# Patient Record
Sex: Female | Born: 1950 | Race: Black or African American | Hispanic: No | Marital: Married | Smoking: Never smoker
Health system: Southern US, Community
[De-identification: ages and names within clinical notes are randomized; demographics above are authoritative.]

## PROBLEM LIST (undated history)

## (undated) DIAGNOSIS — I341 Nonrheumatic mitral (valve) prolapse: Secondary | ICD-10-CM

## (undated) DIAGNOSIS — I10 Essential (primary) hypertension: Secondary | ICD-10-CM

## (undated) DIAGNOSIS — M069 Rheumatoid arthritis, unspecified: Secondary | ICD-10-CM

## (undated) DIAGNOSIS — M1990 Unspecified osteoarthritis, unspecified site: Secondary | ICD-10-CM

## (undated) DIAGNOSIS — J849 Interstitial pulmonary disease, unspecified: Secondary | ICD-10-CM

## (undated) DIAGNOSIS — D509 Iron deficiency anemia, unspecified: Secondary | ICD-10-CM

## (undated) HISTORY — PX: SHOULDER SURGERY: SHX246

## (undated) HISTORY — PX: ELBOW SURGERY: SHX618

## (undated) HISTORY — PX: FOOT SURGERY: SHX648

## (undated) HISTORY — PX: CHOLECYSTECTOMY: SHX55

## (undated) HISTORY — PX: ABDOMINAL HYSTERECTOMY: SHX81

## (undated) HISTORY — PX: KNEE SURGERY: SHX244

## (undated) HISTORY — PX: TOTAL HIP ARTHROPLASTY: SHX124

---

## 2011-09-14 DIAGNOSIS — I10 Essential (primary) hypertension: Secondary | ICD-10-CM | POA: Diagnosis present

## 2011-09-14 DIAGNOSIS — Z8679 Personal history of other diseases of the circulatory system: Secondary | ICD-10-CM | POA: Insufficient documentation

## 2014-09-14 ENCOUNTER — Encounter (HOSPITAL_COMMUNITY): Payer: Self-pay | Admitting: Emergency Medicine

## 2014-09-14 ENCOUNTER — Emergency Department (HOSPITAL_COMMUNITY)
Admission: EM | Admit: 2014-09-14 | Discharge: 2014-09-14 | Disposition: A | Discharge status: Home / Self Care | Payer: Medicare Other | Attending: Emergency Medicine | Admitting: Emergency Medicine

## 2014-09-14 DIAGNOSIS — S90821A Blister (nonthermal), right foot, initial encounter: Secondary | ICD-10-CM

## 2014-09-14 DIAGNOSIS — Z9889 Other specified postprocedural states: Secondary | ICD-10-CM | POA: Insufficient documentation

## 2014-09-14 DIAGNOSIS — L988 Other specified disorders of the skin and subcutaneous tissue: Secondary | ICD-10-CM | POA: Insufficient documentation

## 2014-09-14 DIAGNOSIS — I10 Essential (primary) hypertension: Secondary | ICD-10-CM | POA: Diagnosis not present

## 2014-09-14 DIAGNOSIS — Z862 Personal history of diseases of the blood and blood-forming organs and certain disorders involving the immune mechanism: Secondary | ICD-10-CM | POA: Diagnosis not present

## 2014-09-14 DIAGNOSIS — Z8739 Personal history of other diseases of the musculoskeletal system and connective tissue: Secondary | ICD-10-CM | POA: Insufficient documentation

## 2014-09-14 HISTORY — DX: Nonrheumatic mitral (valve) prolapse: I34.1

## 2014-09-14 HISTORY — DX: Unspecified osteoarthritis, unspecified site: M19.90

## 2014-09-14 HISTORY — DX: Iron deficiency anemia, unspecified: D50.9

## 2014-09-14 HISTORY — DX: Rheumatoid arthritis, unspecified: M06.9

## 2014-09-14 HISTORY — DX: Essential (primary) hypertension: I10

## 2014-09-14 MED ORDER — CEPHALEXIN 500 MG PO CAPS
500.0000 mg | ORAL_CAPSULE | Freq: Once | ORAL | Status: AC
  Administered 2014-09-14: 500 mg via ORAL
  Filled 2014-09-14: qty 1

## 2014-09-14 MED ORDER — CEPHALEXIN 500 MG PO CAPS: 500.0000 mg | ORAL_CAPSULE | Freq: Four times a day (QID) | ORAL | Status: DC

## 2014-09-14 NOTE — ED Provider Notes (Signed)
CSN: [id number]     Arrival date & time 09/14/14  2139 History  This chart was scribed for non-physician practitioner, Emilia Beck, PA-C , working with Ward Givens, MD by Milly Jakob, ED Scribe. The patient was seen in room WTR7/WTR7. Patient's care was started at 10:46 PM.   Chief Complaint  Patient presents with  . Blister   The history is provided by the patient. No language interpreter was used.   HPI Comments: Bridget Woodard is a 63 y.o. female who presents to the Emergency Department complaining of a painful, itchy, blister on her right second toe. She reports that she noticed it when she removed her shoes to go to bed this evening. She reports that the pain is exacerbated by moving her toe. She denies any other acute problems. She denies having a PCP.  Past Medical History  Diagnosis Date  . RA (rheumatoid arthritis)   . Degenerative joint disease   . Microcytic anemia   . Hypertension   . Mitral valve prolapse    Past Surgical History  Procedure Laterality Date  . Shoulder surgery    . Total hip arthroplasty    . Elbow surgery    . Knee surgery    . Foot surgery    . Abdominal hysterectomy    . Cholecystectomy     No family history on file. History  Substance Use Topics  . Smoking status: Never Smoker   . Smokeless tobacco: Not on file  . Alcohol Use: No   OB History   Grav Para Term Preterm Abortions TAB SAB Ect Mult Living                 Review of Systems  Skin: Positive for color change (blister).  All other systems reviewed and are negative.   Allergies  Aspirin; Darvon; Iodine; Percocet; Shellfish allergy; and Sulfa antibiotics  Home Medications   Prior to Admission medications   Not on File   Triage Vitals: BP 158/76  Pulse 76  Temp(Src) 97.6 F (36.4 C) (Oral)  Resp 18  Ht 5\' 6"  (1.676 m)  Wt 165 lb (74.844 kg)  BMI 26.64 kg/m2  SpO2 99% Physical Exam  Nursing note and vitals reviewed. Constitutional: She is oriented to person,  place, and time. She appears well-developed and well-nourished. No distress.  HENT:  Head: Normocephalic and atraumatic.  Eyes: Conjunctivae and EOM are normal.  Neck: Neck supple. No tracheal deviation present.  Cardiovascular: Normal rate.   Pulmonary/Chest: Effort normal. No respiratory distress.  Musculoskeletal: Normal range of motion.  Neurological: She is alert and oriented to person, place, and time.  Skin: Skin is warm and dry.  Fluid filled blister to dorsal right second toe that is tender to palpation.  Psychiatric: She has a normal mood and affect. Her behavior is normal.    ED Course  Procedures (including critical care time) DIAGNOSTIC STUDIES: Oxygen Saturation is 99% on room air, normal by my interpretation.    COORDINATION OF CARE: 10:50 PM-Discussed treatment plan which includes ABX with pt at bedside and pt agreed to plan.   Labs Review Labs Reviewed - No data to display  Imaging Review No results found.   EKG Interpretation None      MDM   Final diagnoses:  Blister of foot, right, initial encounter    Patient will have Keflex for possible infected blister of right second toe. Vital stable and patient afebrile.   I personally performed the services described in  this documentation, which was scribed in my presence. The recorded information has been reviewed and is accurate.     Emilia Beck, PA-C 09/15/14 0150  Ward Givens, MD 09/27/14 1250

## 2014-09-14 NOTE — ED Notes (Signed)
Pt presents with blister to R second toe, unsure of etiology. Painful. Non draining

## 2014-09-14 NOTE — Discharge Instructions (Signed)
Take Keflex as directed until gone. Return to the ED with worsening or concerning symptoms.

## 2016-03-09 DIAGNOSIS — G894 Chronic pain syndrome: Secondary | ICD-10-CM | POA: Insufficient documentation

## 2019-01-09 DIAGNOSIS — M069 Rheumatoid arthritis, unspecified: Secondary | ICD-10-CM | POA: Diagnosis present

## 2020-06-04 DIAGNOSIS — Z7960 Long term (current) use of unspecified immunomodulators and immunosuppressants: Secondary | ICD-10-CM

## 2020-06-04 DIAGNOSIS — Z79899 Other long term (current) drug therapy: Secondary | ICD-10-CM

## 2020-09-02 DIAGNOSIS — F411 Generalized anxiety disorder: Secondary | ICD-10-CM | POA: Diagnosis present

## 2021-02-19 ENCOUNTER — Emergency Department: Payer: Medicare Other

## 2021-02-19 ENCOUNTER — Inpatient Hospital Stay
Admission: EM | Admit: 2021-02-19 | Discharge: 2021-02-26 | DRG: 394 | Disposition: A | Discharge status: Home / Self Care | Payer: Medicare Other | Attending: Internal Medicine | Admitting: Internal Medicine

## 2021-02-19 ENCOUNTER — Other Ambulatory Visit: Payer: Self-pay

## 2021-02-19 DIAGNOSIS — Z885 Allergy status to narcotic agent status: Secondary | ICD-10-CM

## 2021-02-19 DIAGNOSIS — Z886 Allergy status to analgesic agent status: Secondary | ICD-10-CM

## 2021-02-19 DIAGNOSIS — D84821 Immunodeficiency due to drugs: Secondary | ICD-10-CM | POA: Diagnosis present

## 2021-02-19 DIAGNOSIS — K559 Vascular disorder of intestine, unspecified: Principal | ICD-10-CM | POA: Diagnosis present

## 2021-02-19 DIAGNOSIS — Z96642 Presence of left artificial hip joint: Secondary | ICD-10-CM | POA: Diagnosis present

## 2021-02-19 DIAGNOSIS — I471 Supraventricular tachycardia: Secondary | ICD-10-CM | POA: Diagnosis present

## 2021-02-19 DIAGNOSIS — I10 Essential (primary) hypertension: Secondary | ICD-10-CM | POA: Diagnosis present

## 2021-02-19 DIAGNOSIS — Z9049 Acquired absence of other specified parts of digestive tract: Secondary | ICD-10-CM

## 2021-02-19 DIAGNOSIS — F1190 Opioid use, unspecified, uncomplicated: Secondary | ICD-10-CM

## 2021-02-19 DIAGNOSIS — Z91013 Allergy to seafood: Secondary | ICD-10-CM

## 2021-02-19 DIAGNOSIS — Z79899 Other long term (current) drug therapy: Secondary | ICD-10-CM

## 2021-02-19 DIAGNOSIS — R197 Diarrhea, unspecified: Secondary | ICD-10-CM | POA: Diagnosis not present

## 2021-02-19 DIAGNOSIS — K529 Noninfective gastroenteritis and colitis, unspecified: Secondary | ICD-10-CM

## 2021-02-19 DIAGNOSIS — Z20822 Contact with and (suspected) exposure to covid-19: Secondary | ICD-10-CM | POA: Diagnosis present

## 2021-02-19 DIAGNOSIS — I341 Nonrheumatic mitral (valve) prolapse: Secondary | ICD-10-CM | POA: Diagnosis present

## 2021-02-19 DIAGNOSIS — F419 Anxiety disorder, unspecified: Secondary | ICD-10-CM | POA: Diagnosis present

## 2021-02-19 DIAGNOSIS — R55 Syncope and collapse: Secondary | ICD-10-CM

## 2021-02-19 DIAGNOSIS — D509 Iron deficiency anemia, unspecified: Secondary | ICD-10-CM | POA: Diagnosis present

## 2021-02-19 DIAGNOSIS — J8410 Pulmonary fibrosis, unspecified: Secondary | ICD-10-CM | POA: Diagnosis present

## 2021-02-19 DIAGNOSIS — Z91041 Radiographic dye allergy status: Secondary | ICD-10-CM

## 2021-02-19 DIAGNOSIS — M069 Rheumatoid arthritis, unspecified: Secondary | ICD-10-CM | POA: Diagnosis present

## 2021-02-19 DIAGNOSIS — E876 Hypokalemia: Secondary | ICD-10-CM | POA: Diagnosis present

## 2021-02-19 DIAGNOSIS — F411 Generalized anxiety disorder: Secondary | ICD-10-CM | POA: Diagnosis present

## 2021-02-19 DIAGNOSIS — G8929 Other chronic pain: Secondary | ICD-10-CM | POA: Diagnosis present

## 2021-02-19 DIAGNOSIS — E860 Dehydration: Secondary | ICD-10-CM | POA: Diagnosis present

## 2021-02-19 DIAGNOSIS — D62 Acute posthemorrhagic anemia: Secondary | ICD-10-CM | POA: Diagnosis present

## 2021-02-19 DIAGNOSIS — Z79891 Long term (current) use of opiate analgesic: Secondary | ICD-10-CM

## 2021-02-19 DIAGNOSIS — N179 Acute kidney failure, unspecified: Secondary | ICD-10-CM | POA: Diagnosis present

## 2021-02-19 DIAGNOSIS — Z882 Allergy status to sulfonamides status: Secondary | ICD-10-CM

## 2021-02-19 HISTORY — DX: Interstitial pulmonary disease, unspecified: J84.9

## 2021-02-19 LAB — COMPREHENSIVE METABOLIC PANEL
ALT: 20 U/L (ref 0–44)
AST: 34 U/L (ref 15–41)
Albumin: 4.1 g/dL (ref 3.5–5.0)
Alkaline Phosphatase: 111 U/L (ref 38–126)
Anion gap: 12 (ref 5–15)
BUN: 25 mg/dL — ABNORMAL HIGH (ref 8–23)
CO2: 25 mmol/L (ref 22–32)
Calcium: 9.2 mg/dL (ref 8.9–10.3)
Chloride: 102 mmol/L (ref 98–111)
Creatinine, Ser: 1.69 mg/dL — ABNORMAL HIGH (ref 0.44–1.00)
GFR, Estimated: 33 mL/min — ABNORMAL LOW (ref >60)
Glucose, Bld: 174 mg/dL — ABNORMAL HIGH (ref 70–99)
Potassium: 2.9 mmol/L — ABNORMAL LOW (ref 3.5–5.1)
Sodium: 139 mmol/L (ref 135–145)
Total Bilirubin: 0.6 mg/dL (ref 0.3–1.2)
Total Protein: 8.4 g/dL — ABNORMAL HIGH (ref 6.5–8.1)

## 2021-02-19 LAB — ETHANOL: Alcohol, Ethyl (B): <10 mg/dL (ref <10)

## 2021-02-19 LAB — TROPONIN I (HIGH SENSITIVITY): Troponin I (High Sensitivity): 11 ng/L (ref <18)

## 2021-02-19 LAB — RESP PANEL BY RT-PCR (FLU A&B, COVID) ARPGX2
Influenza A by PCR: NEGATIVE
Influenza B by PCR: NEGATIVE
SARS Coronavirus 2 by RT PCR: NEGATIVE

## 2021-02-19 LAB — MAGNESIUM: Magnesium: 1.7 mg/dL (ref 1.7–2.4)

## 2021-02-19 LAB — LIPASE, BLOOD: Lipase: 33 U/L (ref 11–51)

## 2021-02-19 MED ORDER — POTASSIUM CHLORIDE 10 MEQ/100ML IV SOLN
10.0000 meq | INTRAVENOUS | Status: AC
Start: 2021-02-19 — End: 2021-02-20
  Administered 2021-02-20 (×2): 10 meq via INTRAVENOUS
  Filled 2021-02-19 (×2): qty 100

## 2021-02-19 MED ORDER — POTASSIUM CHLORIDE CRYS ER 20 MEQ PO TBCR
40.0000 meq | EXTENDED_RELEASE_TABLET | Freq: Once | ORAL | Status: DC
  Filled 2021-02-19: qty 2

## 2021-02-19 MED ORDER — SODIUM CHLORIDE 0.9 % IV BOLUS
1000.0000 mL | Freq: Once | INTRAVENOUS | Status: AC
  Administered 2021-02-19: 1000 mL via INTRAVENOUS

## 2021-02-19 MED ORDER — ONDANSETRON HCL 4 MG/2ML IJ SOLN
4.0000 mg | Freq: Once | INTRAMUSCULAR | Status: AC | PRN
  Administered 2021-02-20: 4 mg via INTRAVENOUS
  Filled 2021-02-19 (×2): qty 2

## 2021-02-19 NOTE — ED Provider Notes (Signed)
[facility] Emergency Department Provider Note  ____________________________________________   Event Date/Time   First MD Initiated Contact with Patient 02/19/21 2012     (approximate)  I have reviewed the triage vital signs and the nursing notes.   HISTORY  Chief Complaint Loss of Consciousness    HPI [name] is a 70 y.o. female with rheumatoid arthritis who comes in for syncopal episode.  Patient was with her husband and riding in the car when she started having multiple episodes of vomiting and diarrhea and then passed out.  She reports some abdominal pain and headache.  The abdominal pain is constant, nothing makes it better, nothing makes it worse.  Denies having this happen previously.  Was reportedly fine prior to the vomiting and diarrhea          Past Medical History:  Diagnosis Date  . Degenerative joint disease   . Hypertension   . Microcytic anemia   . Mitral valve prolapse   . RA (rheumatoid arthritis) (HCC)     There are no problems to display for this patient.   Past Surgical History:  Procedure Laterality Date  . ABDOMINAL HYSTERECTOMY    . CHOLECYSTECTOMY    . ELBOW SURGERY    . FOOT SURGERY    . KNEE SURGERY    . SHOULDER SURGERY    . TOTAL HIP ARTHROPLASTY      Prior to Admission medications   Medication Sig Start Date End Date Taking? Authorizing Provider  cephALEXin (KEFLEX) 500 MG capsule Take 1 capsule (500 mg total) by mouth 4 (four) times daily. 09/14/14   Emilia Beck, PA-C    Allergies Aspirin, Darvon [propoxyphene], Iodine, Percocet [oxycodone-acetaminophen], Shellfish allergy, and Sulfa antibiotics  No family history on file.  Social History Social History   Tobacco Use  . Smoking status: Never Smoker  Substance Use Topics  . Alcohol use: No  . Drug use: No      Review of Systems Constitutional: No fever/chills, LOC Eyes: No visual changes. ENT: No sore throat. Cardiovascular:  Denies chest pain. Respiratory: Denies shortness of breath. Gastrointestinal: Positive vomiting, diarrhea, abdominal pain Genitourinary: Negative for dysuria. Musculoskeletal: Negative for back pain. Skin: Negative for rash. Neurological: Negative for headaches, focal weakness or numbness. All other ROS negative ____________________________________________   PHYSICAL EXAM:  VITAL SIGNS: ED Triage Vitals  Enc Vitals Group     BP 02/19/21 2017 114/65     Pulse Rate 02/19/21 2017 (!) 115     Resp --      Temp 02/19/21 2017 97.6 F (36.4 C)     Temp Source 02/19/21 2017 Oral     SpO2 02/19/21 2017 99 %     Weight 02/19/21 2015 168 lb (76.2 kg)     Height 02/19/21 2015 5\' 6"  (1.676 m)     Head Circumference --      Peak Flow --      Pain Score --      Pain Loc --      Pain Edu? --      Excl. in GC? --     Constitutional: Alert and oriented.  But with sprain and very Eyes: Conjunctivae are normal. EOMI. Head: Atraumatic. Nose: No congestion/rhinnorhea. Mouth/Throat: Mucous membranes are moist.   Neck: No stridor. Trachea Midline. FROM Cardiovascular: Normal rate, regular rhythm. Grossly normal heart sounds.  Good peripheral circulation. Respiratory: Normal respiratory effort.  No retractions. Lungs CTAB. Gastrointestinal: Soft with some mild tenderness with old surgical  scar no distention. No abdominal bruits.  Musculoskeletal: No lower extremity tenderness nor edema.  No joint effusions. Neurologic:  Normal speech and language. No gross focal neurologic deficits are appreciated.  Wiggles toes bilaterally, squeezes hands weakly bilaterally. Skin:  Skin is warm, dry and intact. No rash noted. Psychiatric: Mood and affect are normal. Speech and behavior are normal. GU: Deferred   ____________________________________________   LABS (all labs ordered are listed, but only abnormal results are displayed)  Labs Reviewed  RESP PANEL BY RT-PCR (FLU A&B, COVID) ARPGX2  URINE  CULTURE  GASTROINTESTINAL PANEL BY PCR, STOOL (REPLACES STOOL CULTURE)  C DIFFICILE QUICK SCREEN W PCR REFLEX  CBC WITH DIFFERENTIAL/PLATELET  COMPREHENSIVE METABOLIC PANEL  LIPASE, BLOOD  URINALYSIS, COMPLETE (UACMP) WITH MICROSCOPIC  ETHANOL  MAGNESIUM  CBG MONITORING, ED  TROPONIN I (HIGH SENSITIVITY)  TROPONIN I (HIGH SENSITIVITY)   ____________________________________________   ED ECG REPORT I, Concha Se, the attending physician, personally viewed and interpreted this ECG.  Sinus tachycardia rate of 113, no ST elevation, no T wave inversions, normal intervals ____________________________________________  RADIOLOGY Vela Prose, personally viewed and evaluated these images (plain radiographs) as part of my medical decision making, as well as reviewing the written report by the radiologist.  ED MD interpretation: No pneumonia  Official radiology report(s): CT ABDOMEN PELVIS WO CONTRAST  Result Date: 02/19/2021 CLINICAL DATA:  Vomiting and diarrhea EXAM: CT ABDOMEN AND PELVIS WITHOUT CONTRAST TECHNIQUE: Multidetector CT imaging of the abdomen and pelvis was performed following the standard protocol without IV contrast. COMPARISON:  None. FINDINGS: Lower chest: Lung bases demonstrate lower lobe bronchiectasis and pulmonary fibrosis. Mild fibrosis in the right middle lobe and lingula. No acute airspace disease. Hepatobiliary: Status post cholecystectomy. Calcified granuloma in the liver. Intra and extrahepatic biliary ductal dilatation, common bile duct diameter up to 12 mm. Pancreas: Unremarkable. No pancreatic ductal dilatation or surrounding inflammatory changes. Spleen: Normal in size without focal abnormality. Adrenals/Urinary Tract: Adrenal glands are normal. Kidneys show no hydronephrosis. The urinary bladder is partially obscured by artifact Stomach/Bowel: Stomach nonenlarged. Fluid-filled nondistended pelvic small bowel loops. Diffuse fluid within the colon. No acute  bowel wall thickening. Negative appendix. Vascular/Lymphatic: Nonaneurysmal aorta.  No suspicious nodes Reproductive: Status post hysterectomy. No adnexal masses. Other: No free air or free fluid Musculoskeletal: Fatty atrophy of left iliopsoas muscle. Left hip replacement with artifact. No acute or suspicious osseous abnormality. IMPRESSION: 1. Fluid-filled small and large bowel, consistent with a diarrheal illness and probable enteritis. No acute bowel wall thickening. No obstruction. 2. Intra and extrahepatic biliary ductal dilatation post cholecystectomy. Recommend correlation with LFTs. 3. Pulmonary fibrosis at the lung bases. Electronically Signed   By: Jasmine Pang M.D.   On: 02/19/2021 21:39   CT Head Wo Contrast  Result Date: 02/19/2021 CLINICAL DATA:  Episode of emesis and diarrhea with syncope EXAM: CT HEAD WITHOUT CONTRAST CT CERVICAL SPINE WITHOUT CONTRAST TECHNIQUE: Multidetector CT imaging of the head and cervical spine was performed following the standard protocol without intravenous contrast. Multiplanar CT image reconstructions of the cervical spine were also generated. COMPARISON:  None. FINDINGS: CT HEAD FINDINGS Brain: No evidence of acute infarction, hemorrhage, hydrocephalus, extra-axial collection, visible mass lesion or mass effect. Symmetric prominence of the ventricles, cisterns and sulci compatible with parenchymal volume loss. Patchy areas of white matter hypoattenuation are most compatible with chronic microvascular angiopathy. Partially empty appearance of the sella. Remaining midline intracranial structures are unremarkable. Cerebellar tonsils are normally positioned. Scattered benign dural calcifications. Vascular:  Atherosclerotic calcification of the carotid siphons. No hyperdense vessel. Skull: No calvarial fracture or suspicious osseous lesion. No scalp swelling or hematoma. Hyperostosis frontalis interna, a typically benign incidental finding. Sinuses/Orbits: Paranasal  sinuses and mastoid air cells are predominantly clear. Included orbital structures are unremarkable. Other: Debris noted in the bilateral external auditory canals. Edentulous with dental prostheses. Mild bilateral TMJ arthrosis. CT CERVICAL SPINE FINDINGS Alignment: Stabilization collar is absent at the time of exam. There is notable cervical flexion. Likely contributing to the reversal the normal cervical lordosis. No evidence of traumatic listhesis. No abnormally widened, perched or jumped facets. Normal alignment of the craniocervical and atlantoaxial articulations. Skull base and vertebrae: No acute skull base fracture. No vertebral body fracture or height loss. Normal bone mineralization. No worrisome osseous lesions. Multilevel cervical spondylitic changes as below. Mild arthrosis at the atlantodental interval with some spurring about the anterior arch C1. Soft tissues and spinal canal: No pre or paravertebral fluid or swelling. No visible canal hematoma. Disc levels: Multilevel intervertebral disc height loss with spondylitic endplate changes. Discogenic spurring is most pronounced anteriorly albeit with some shallow posterior disc osteophyte complexes in addition to uncinate spurring and facet hypertrophic changes. Some partial effacement of the ventral thecal sac is noted C4-C7 resulting in mild canal stenosis. At least mild bilateral neural foraminal narrowing across these levels as well. Upper chest: No acute abnormality in the upper chest or imaged lung apices. Other: No concerning thyroid nodules or masses. IMPRESSION: 1. No acute intracranial abnormality. 2. Mild parenchymal volume loss and chronic microvascular ischemic white matter disease. 3. Partially empty appearance of the sella, a nonspecific finding. 4. Debris in the bilateral external auditory canals, correlate for cerumen impaction. 5. No acute fracture or traumatic listhesis of the cervical spine. 6. Multilevel cervical spondylitic and facet  degenerative changes, most pronounced C4-C7 where there is mild canal stenosis neural foraminal narrowing. Electronically Signed   By: Kreg Shropshire M.D.   On: 02/19/2021 21:40   CT Cervical Spine Wo Contrast  Result Date: 02/19/2021 CLINICAL DATA:  Episode of emesis and diarrhea with syncope EXAM: CT HEAD WITHOUT CONTRAST CT CERVICAL SPINE WITHOUT CONTRAST TECHNIQUE: Multidetector CT imaging of the head and cervical spine was performed following the standard protocol without intravenous contrast. Multiplanar CT image reconstructions of the cervical spine were also generated. COMPARISON:  None. FINDINGS: CT HEAD FINDINGS Brain: No evidence of acute infarction, hemorrhage, hydrocephalus, extra-axial collection, visible mass lesion or mass effect. Symmetric prominence of the ventricles, cisterns and sulci compatible with parenchymal volume loss. Patchy areas of white matter hypoattenuation are most compatible with chronic microvascular angiopathy. Partially empty appearance of the sella. Remaining midline intracranial structures are unremarkable. Cerebellar tonsils are normally positioned. Scattered benign dural calcifications. Vascular: Atherosclerotic calcification of the carotid siphons. No hyperdense vessel. Skull: No calvarial fracture or suspicious osseous lesion. No scalp swelling or hematoma. Hyperostosis frontalis interna, a typically benign incidental finding. Sinuses/Orbits: Paranasal sinuses and mastoid air cells are predominantly clear. Included orbital structures are unremarkable. Other: Debris noted in the bilateral external auditory canals. Edentulous with dental prostheses. Mild bilateral TMJ arthrosis. CT CERVICAL SPINE FINDINGS Alignment: Stabilization collar is absent at the time of exam. There is notable cervical flexion. Likely contributing to the reversal the normal cervical lordosis. No evidence of traumatic listhesis. No abnormally widened, perched or jumped facets. Normal alignment of the  craniocervical and atlantoaxial articulations. Skull base and vertebrae: No acute skull base fracture. No vertebral body fracture or height loss. Normal bone mineralization.  No worrisome osseous lesions. Multilevel cervical spondylitic changes as below. Mild arthrosis at the atlantodental interval with some spurring about the anterior arch C1. Soft tissues and spinal canal: No pre or paravertebral fluid or swelling. No visible canal hematoma. Disc levels: Multilevel intervertebral disc height loss with spondylitic endplate changes. Discogenic spurring is most pronounced anteriorly albeit with some shallow posterior disc osteophyte complexes in addition to uncinate spurring and facet hypertrophic changes. Some partial effacement of the ventral thecal sac is noted C4-C7 resulting in mild canal stenosis. At least mild bilateral neural foraminal narrowing across these levels as well. Upper chest: No acute abnormality in the upper chest or imaged lung apices. Other: No concerning thyroid nodules or masses. IMPRESSION: 1. No acute intracranial abnormality. 2. Mild parenchymal volume loss and chronic microvascular ischemic white matter disease. 3. Partially empty appearance of the sella, a nonspecific finding. 4. Debris in the bilateral external auditory canals, correlate for cerumen impaction. 5. No acute fracture or traumatic listhesis of the cervical spine. 6. Multilevel cervical spondylitic and facet degenerative changes, most pronounced C4-C7 where there is mild canal stenosis neural foraminal narrowing. Electronically Signed   By: Kreg Shropshire M.D.   On: 02/19/2021 21:40   DG Chest Portable 1 View  Result Date: 02/19/2021 CLINICAL DATA:  Shortness of breath, vomiting, and diarrhea. Then passed out. EXAM: PORTABLE CHEST 1 VIEW COMPARISON:  None. FINDINGS: Shallow inspiration. Coarse linear and alveolar infiltrates in both lung bases, greater on the right, with evidence of bronchiectasis and bronchial wall  thickening. Changes could represent chronic fibrosis, superimposed pneumonia/atelectasis, or aspiration. Heart size and pulmonary vascularity are normal. Mediastinal contours appear intact. Degenerative changes and postoperative change in the right shoulder. IMPRESSION: Coarse linear and alveolar infiltrates in both lung bases with evidence of bronchiectasis and bronchial wall thickening. Electronically Signed   By: Burman Nieves M.D.   On: 02/19/2021 21:41    ____________________________________________   PROCEDURES  Procedure(s) performed (including Critical Care):  .1-3 Lead EKG Interpretation Performed by: Concha Se, MD Authorized by: Concha Se, MD     Interpretation: abnormal     ECG rate:  90-110s   ECG rate assessment: normal     Rhythm: sinus rhythm     Ectopy: none     Conduction: normal   Comments:     Initially sinus tachycardia but normalized after fluids     ____________________________________________   INITIAL IMPRESSION / ASSESSMENT AND PLAN / ED COURSE  Bridget Woodard was evaluated in Emergency Department on 02/19/2021 for the symptoms described in the history of present illness. She was evaluated in the context of the global COVID-19 pandemic, which necessitated consideration that the patient might be at risk for infection with the SARS-CoV-2 virus that causes COVID-19. Institutional protocols and algorithms that pertain to the evaluation of patients at risk for COVID-19 are in a state of rapid change based on information released by regulatory bodies including the CDC and federal and state organizations. These policies and algorithms were followed during the patient's care in the ED.     Patient is a 70 year old who comes in with multiple episodes of vomiting diarrhea than syncopal episode.  Most likely secondary to dehydration versus vasovagal.  Will get labs evaluate for Electra abnormalities, AKI.  Will get CT head given patient is somewhat sleepy and  whispering to answer my questions to make sure no evidence of intercranial hemorrhage and CT abdomen to evaluate for obstruction.  Will give patient 2 L of fluid  and reassess afterwards.   CT head was negative, CT abdomen shows signs of enteritis.  There was also concern for some ductal dilation post cholecystectomy but her LFTs are normal.  Giving significant amount of diarrhea and ct concerning for enteritis will get stool studies and c diff.   Patient handed off to oncoming team pending labs and reevaluation after fluid        ____________________________________________   FINAL CLINICAL IMPRESSION(S) / ED DIAGNOSES   Final diagnoses:  Syncope, unspecified syncope type  AKI (acute kidney injury) (HCC)  Enteritis      MEDICATIONS GIVEN DURING THIS VISIT:  Medications  enoxaparin (LOVENOX) injection 40 mg (has no administration in time range)  lactated ringers infusion ( Intravenous New Bag/Given 02/20/21 0421)  acetaminophen (TYLENOL) tablet 650 mg (has no administration in time range)    Or  acetaminophen (TYLENOL) suppository 650 mg (has no administration in time range)  morphine 2 MG/ML injection 2 mg (2 mg Intravenous Given 02/20/21 0417)  ondansetron (ZOFRAN) tablet 4 mg (has no administration in time range)    Or  ondansetron (ZOFRAN) injection 4 mg (has no administration in time range)  promethazine (PHENERGAN) 12.5 mg in sodium chloride 0.9 % 50 mL IVPB (has no administration in time range)  pantoprazole (PROTONIX) injection 40 mg (40 mg Intravenous Given 02/20/21 0416)  sertraline (ZOLOFT) tablet 150 mg (150 mg Oral Given 02/20/21 0601)  amitriptyline (ELAVIL) tablet 50 mg (50 mg Oral Given 02/20/21 0601)  sodium chloride 0.9 % bolus 1,000 mL (0 mLs Intravenous Stopped 02/20/21 0206)  ondansetron (ZOFRAN) injection 4 mg (4 mg Intravenous Given 02/20/21 0201)  potassium chloride 10 mEq in 100 mL IVPB (0 mEq Intravenous Stopped 02/20/21 0256)  potassium chloride (KLOR-CON)  packet 40 mEq (40 mEq Oral Given 02/20/21 0028)  magnesium sulfate IVPB 2 g 50 mL (0 g Intravenous Stopped 02/20/21 0126)  fentaNYL (SUBLIMAZE) injection 50 mcg (50 mcg Intravenous Given 02/20/21 0153)  lactated ringers bolus 1,000 mL (0 mLs Intravenous Stopped 02/20/21 0350)     ED Discharge Orders    None       Note:  This document was prepared using Dragon voice recognition software and may include unintentional dictation errors.   Concha Se, MD 02/20/21 [phone]

## 2021-02-19 NOTE — ED Notes (Signed)
Ladona Ridgel, primary RN at bedside. Report to RN.

## 2021-02-19 NOTE — ED Notes (Signed)
Patient noted to have vomit to hair, and clothing. All clothing, and wig removed and placed at bedside.

## 2021-02-19 NOTE — ED Triage Notes (Signed)
Patient arrives via EMS. EMS reports the patient was riding in the car with her husband, when she had an episode of vomiting, and diarrhea, and then "passed out." EMS reports the patient reported to them that the patient had diarrhea x several times today prior to their car ride. Patient is responsive to verbal and painful stimuli on arrival, but is not forthcoming with information, and is delayed in answering questions. Patient appears drowsy. Diaphoresis noted to the patient's forehead. Husband and patient deny drug or alcohol intake.

## 2021-02-20 ENCOUNTER — Encounter: Payer: Self-pay | Admitting: Internal Medicine

## 2021-02-20 DIAGNOSIS — F1190 Opioid use, unspecified, uncomplicated: Secondary | ICD-10-CM

## 2021-02-20 DIAGNOSIS — Z20822 Contact with and (suspected) exposure to covid-19: Secondary | ICD-10-CM | POA: Diagnosis present

## 2021-02-20 DIAGNOSIS — K529 Noninfective gastroenteritis and colitis, unspecified: Secondary | ICD-10-CM | POA: Diagnosis not present

## 2021-02-20 DIAGNOSIS — Z91041 Radiographic dye allergy status: Secondary | ICD-10-CM | POA: Diagnosis not present

## 2021-02-20 DIAGNOSIS — N179 Acute kidney failure, unspecified: Secondary | ICD-10-CM

## 2021-02-20 DIAGNOSIS — Z79899 Other long term (current) drug therapy: Secondary | ICD-10-CM | POA: Diagnosis not present

## 2021-02-20 DIAGNOSIS — Z882 Allergy status to sulfonamides status: Secondary | ICD-10-CM | POA: Diagnosis not present

## 2021-02-20 DIAGNOSIS — E876 Hypokalemia: Secondary | ICD-10-CM | POA: Diagnosis present

## 2021-02-20 DIAGNOSIS — I471 Supraventricular tachycardia: Secondary | ICD-10-CM | POA: Diagnosis present

## 2021-02-20 DIAGNOSIS — R112 Nausea with vomiting, unspecified: Secondary | ICD-10-CM | POA: Diagnosis not present

## 2021-02-20 DIAGNOSIS — F419 Anxiety disorder, unspecified: Secondary | ICD-10-CM | POA: Diagnosis present

## 2021-02-20 DIAGNOSIS — Z91013 Allergy to seafood: Secondary | ICD-10-CM | POA: Diagnosis not present

## 2021-02-20 DIAGNOSIS — D62 Acute posthemorrhagic anemia: Secondary | ICD-10-CM | POA: Diagnosis present

## 2021-02-20 DIAGNOSIS — R55 Syncope and collapse: Secondary | ICD-10-CM | POA: Diagnosis not present

## 2021-02-20 DIAGNOSIS — J8410 Pulmonary fibrosis, unspecified: Secondary | ICD-10-CM | POA: Diagnosis present

## 2021-02-20 DIAGNOSIS — F411 Generalized anxiety disorder: Secondary | ICD-10-CM | POA: Diagnosis present

## 2021-02-20 DIAGNOSIS — I10 Essential (primary) hypertension: Secondary | ICD-10-CM | POA: Diagnosis not present

## 2021-02-20 DIAGNOSIS — G8929 Other chronic pain: Secondary | ICD-10-CM | POA: Diagnosis present

## 2021-02-20 DIAGNOSIS — K559 Vascular disorder of intestine, unspecified: Secondary | ICD-10-CM | POA: Diagnosis present

## 2021-02-20 DIAGNOSIS — E860 Dehydration: Secondary | ICD-10-CM | POA: Diagnosis present

## 2021-02-20 DIAGNOSIS — Z886 Allergy status to analgesic agent status: Secondary | ICD-10-CM | POA: Diagnosis not present

## 2021-02-20 DIAGNOSIS — R197 Diarrhea, unspecified: Secondary | ICD-10-CM | POA: Diagnosis present

## 2021-02-20 DIAGNOSIS — Z885 Allergy status to narcotic agent status: Secondary | ICD-10-CM | POA: Diagnosis not present

## 2021-02-20 DIAGNOSIS — M069 Rheumatoid arthritis, unspecified: Secondary | ICD-10-CM | POA: Diagnosis present

## 2021-02-20 DIAGNOSIS — I341 Nonrheumatic mitral (valve) prolapse: Secondary | ICD-10-CM | POA: Diagnosis present

## 2021-02-20 DIAGNOSIS — D509 Iron deficiency anemia, unspecified: Secondary | ICD-10-CM | POA: Diagnosis not present

## 2021-02-20 DIAGNOSIS — Z79891 Long term (current) use of opiate analgesic: Secondary | ICD-10-CM | POA: Diagnosis not present

## 2021-02-20 DIAGNOSIS — D84821 Immunodeficiency due to drugs: Secondary | ICD-10-CM | POA: Diagnosis present

## 2021-02-20 LAB — CBC WITH DIFFERENTIAL/PLATELET
Abs Immature Granulocytes: 0.04 10*3/uL (ref 0.00–0.07)
Basophils Absolute: 0.1 10*3/uL (ref 0.0–0.1)
Basophils Relative: 0 %
Eosinophils Absolute: 0.4 10*3/uL (ref 0.0–0.5)
Eosinophils Relative: 3 %
HCT: 39.4 % (ref 36.0–46.0)
Hemoglobin: 11.9 g/dL — ABNORMAL LOW (ref 12.0–15.0)
Immature Granulocytes: 0 %
Lymphocytes Relative: 59 %
Lymphs Abs: 7.1 10*3/uL — ABNORMAL HIGH (ref 0.7–4.0)
MCH: 24.7 pg — ABNORMAL LOW (ref 26.0–34.0)
MCHC: 30.2 g/dL (ref 30.0–36.0)
MCV: 81.7 fL (ref 80.0–100.0)
Monocytes Absolute: 0.4 10*3/uL (ref 0.1–1.0)
Monocytes Relative: 3 %
Neutro Abs: 4.3 10*3/uL (ref 1.7–7.7)
Neutrophils Relative %: 35 %
Platelets: 506 10*3/uL — ABNORMAL HIGH (ref 150–400)
RBC: 4.82 MIL/uL (ref 3.87–5.11)
RDW: 15.4 % (ref 11.5–15.5)
Smear Review: NORMAL
WBC: 12.3 10*3/uL — ABNORMAL HIGH (ref 4.0–10.5)
nRBC: 0 % (ref 0.0–0.2)

## 2021-02-20 LAB — GASTROINTESTINAL PANEL BY PCR, STOOL (REPLACES STOOL CULTURE)

## 2021-02-20 LAB — URINALYSIS, COMPLETE (UACMP) WITH MICROSCOPIC
Bacteria, UA: NONE SEEN
Bilirubin Urine: NEGATIVE
Glucose, UA: NEGATIVE mg/dL
Hgb urine dipstick: NEGATIVE
Ketones, ur: NEGATIVE mg/dL
Nitrite: NEGATIVE
Protein, ur: NEGATIVE mg/dL
Specific Gravity, Urine: 1.018 (ref 1.005–1.030)
pH: 5 (ref 5.0–8.0)

## 2021-02-20 LAB — C DIFFICILE QUICK SCREEN W PCR REFLEX
C Diff antigen: NEGATIVE
C Diff interpretation: NOT DETECTED
C Diff toxin: NEGATIVE

## 2021-02-20 LAB — BASIC METABOLIC PANEL
Anion gap: 11 (ref 5–15)
BUN: 23 mg/dL (ref 8–23)
CO2: 22 mmol/L (ref 22–32)
Calcium: 8.4 mg/dL — ABNORMAL LOW (ref 8.9–10.3)
Chloride: 106 mmol/L (ref 98–111)
Creatinine, Ser: 1.33 mg/dL — ABNORMAL HIGH (ref 0.44–1.00)
GFR, Estimated: 44 mL/min — ABNORMAL LOW (ref >60)
Glucose, Bld: 156 mg/dL — ABNORMAL HIGH (ref 70–99)
Potassium: 4.1 mmol/L (ref 3.5–5.1)
Sodium: 139 mmol/L (ref 135–145)

## 2021-02-20 LAB — CBC
HCT: 37.4 % (ref 36.0–46.0)
Hemoglobin: 11.2 g/dL — ABNORMAL LOW (ref 12.0–15.0)
MCH: 25.1 pg — ABNORMAL LOW (ref 26.0–34.0)
MCHC: 29.9 g/dL — ABNORMAL LOW (ref 30.0–36.0)
MCV: 83.7 fL (ref 80.0–100.0)
Platelets: 408 10*3/uL — ABNORMAL HIGH (ref 150–400)
RBC: 4.47 MIL/uL (ref 3.87–5.11)
RDW: 15.5 % (ref 11.5–15.5)
WBC: 27.9 10*3/uL — ABNORMAL HIGH (ref 4.0–10.5)
nRBC: 0 % (ref 0.0–0.2)

## 2021-02-20 LAB — MAGNESIUM: Magnesium: 1.8 mg/dL (ref 1.7–2.4)

## 2021-02-20 LAB — TROPONIN I (HIGH SENSITIVITY): Troponin I (High Sensitivity): 14 ng/L (ref <18)

## 2021-02-20 LAB — CREATININE, SERUM
Creatinine, Ser: 1.34 mg/dL — ABNORMAL HIGH (ref 0.44–1.00)
GFR, Estimated: 43 mL/min — ABNORMAL LOW (ref >60)

## 2021-02-20 LAB — HIV ANTIBODY (ROUTINE TESTING W REFLEX): HIV Screen 4th Generation wRfx: NONREACTIVE

## 2021-02-20 MED ORDER — POTASSIUM CHLORIDE 20 MEQ PO PACK
40.0000 meq | PACK | Freq: Once | ORAL | Status: AC
  Administered 2021-02-20: 40 meq via ORAL
  Filled 2021-02-20: qty 2

## 2021-02-20 MED ORDER — SODIUM CHLORIDE 0.9 % IV SOLN
12.5000 mg | Freq: Four times a day (QID) | INTRAVENOUS | Status: DC | PRN
  Filled 2021-02-20: qty 0.5

## 2021-02-20 MED ORDER — ALUM & MAG HYDROXIDE-SIMETH 200-200-20 MG/5ML PO SUSP
15.0000 mL | ORAL | Status: DC | PRN
  Administered 2021-02-20 – 2021-02-23 (×5): 15 mL via ORAL
  Filled 2021-02-20 (×6): qty 30

## 2021-02-20 MED ORDER — MORPHINE SULFATE (PF) 2 MG/ML IV SOLN
2.0000 mg | INTRAVENOUS | Status: DC | PRN
Start: 2021-02-20 — End: 2021-02-26
  Administered 2021-02-20 – 2021-02-26 (×27): 2 mg via INTRAVENOUS
  Filled 2021-02-20 (×28): qty 1

## 2021-02-20 MED ORDER — FENTANYL CITRATE (PF) 100 MCG/2ML IJ SOLN
50.0000 ug | Freq: Once | INTRAMUSCULAR | Status: AC
Start: 2021-02-20 — End: 2021-02-20
  Administered 2021-02-20: 50 ug via INTRAVENOUS
  Filled 2021-02-20: qty 2

## 2021-02-20 MED ORDER — SERTRALINE HCL 50 MG PO TABS
150.0000 mg | ORAL_TABLET | Freq: Every day | ORAL | Status: DC
  Administered 2021-02-20 – 2021-02-26 (×7): 150 mg via ORAL
  Filled 2021-02-20 (×8): qty 3

## 2021-02-20 MED ORDER — CIPROFLOXACIN IN D5W 400 MG/200ML IV SOLN
400.0000 mg | Freq: Two times a day (BID) | INTRAVENOUS | Status: DC
  Filled 2021-02-20 (×2): qty 200

## 2021-02-20 MED ORDER — ONDANSETRON HCL 4 MG PO TABS: 4.0000 mg | ORAL_TABLET | Freq: Four times a day (QID) | ORAL | Status: DC | PRN

## 2021-02-20 MED ORDER — METRONIDAZOLE IN NACL 5-0.79 MG/ML-% IV SOLN
500.0000 mg | Freq: Three times a day (TID) | INTRAVENOUS | Status: DC
  Administered 2021-02-20: 500 mg via INTRAVENOUS
  Filled 2021-02-20 (×3): qty 100

## 2021-02-20 MED ORDER — ACETAMINOPHEN 325 MG PO TABS: 650.0000 mg | ORAL_TABLET | Freq: Four times a day (QID) | ORAL | Status: DC | PRN

## 2021-02-20 MED ORDER — AMITRIPTYLINE HCL 25 MG PO TABS
50.0000 mg | ORAL_TABLET | Freq: Every day | ORAL | Status: DC | PRN
  Administered 2021-02-20 – 2021-02-25 (×6): 50 mg via ORAL
  Filled 2021-02-20 (×6): qty 2
  Filled 2021-02-20: qty 1

## 2021-02-20 MED ORDER — ACETAMINOPHEN 650 MG RE SUPP: 650.0000 mg | Freq: Four times a day (QID) | RECTAL | Status: DC | PRN

## 2021-02-20 MED ORDER — ENOXAPARIN SODIUM 40 MG/0.4ML ~~LOC~~ SOLN
40.0000 mg | SUBCUTANEOUS | Status: DC
  Administered 2021-02-20 – 2021-02-25 (×6): 40 mg via SUBCUTANEOUS
  Filled 2021-02-20 (×6): qty 0.4

## 2021-02-20 MED ORDER — LACTATED RINGERS IV SOLN: INTRAVENOUS | Status: DC

## 2021-02-20 MED ORDER — MAGNESIUM SULFATE 2 GM/50ML IV SOLN
2.0000 g | Freq: Once | INTRAVENOUS | Status: AC
  Administered 2021-02-20: 2 g via INTRAVENOUS
  Filled 2021-02-20: qty 50

## 2021-02-20 MED ORDER — ONDANSETRON HCL 4 MG/2ML IJ SOLN
4.0000 mg | Freq: Four times a day (QID) | INTRAMUSCULAR | Status: DC | PRN
  Administered 2021-02-20 – 2021-02-23 (×3): 4 mg via INTRAVENOUS
  Filled 2021-02-20 (×3): qty 2

## 2021-02-20 MED ORDER — PANTOPRAZOLE SODIUM 40 MG IV SOLR
40.0000 mg | INTRAVENOUS | Status: DC
  Administered 2021-02-20 – 2021-02-26 (×6): 40 mg via INTRAVENOUS
  Filled 2021-02-20 (×6): qty 40

## 2021-02-20 MED ORDER — LACTATED RINGERS IV BOLUS
1000.0000 mL | Freq: Once | INTRAVENOUS | Status: AC
  Administered 2021-02-20: 1000 mL via INTRAVENOUS

## 2021-02-20 NOTE — ED Notes (Signed)
Informed RN bed assigned [phone]

## 2021-02-20 NOTE — ED Notes (Signed)
Pt unhooked and able to walk to bedside commode. Informed to hit call bell when finished. Helped back to bed.

## 2021-02-20 NOTE — Progress Notes (Signed)
   02/20/21 2027  Assess: MEWS Score  Temp 99.7 F (37.6 C)  BP (!) 154/78  Pulse Rate (!) 121  Resp 16  SpO2 99 %  O2 Device Room Air  Assess: MEWS Score  MEWS Temp 0  MEWS Systolic 0  MEWS Pulse 2  MEWS RR 0  MEWS LOC 0  MEWS Score 2  MEWS Score Color Yellow  Assess: if the MEWS score is Yellow or Red  Were vital signs taken at a resting state? Yes  Focused Assessment No change from prior assessment  Early Detection of Sepsis Score *See Row Information* Low  MEWS guidelines implemented *See Row Information* No, previously yellow, continue vital signs every 4 hours  Treat  MEWS Interventions Administered scheduled meds/treatments  Pain Scale 0-10  Pain Score 7  Pain Type Acute pain  Pain Location Abdomen  Pain Orientation Lower  Take Vital Signs  Increase Vital Sign Frequency  Yellow: Q 2hr X 2 then Q 4hr X 2, if remains yellow, continue Q 4hrs  Escalate  MEWS: Escalate Yellow: discuss with charge nurse/RN and consider discussing with provider and RRT  Notify: Charge Nurse/RN  Name of Charge Nurse/RN Notified Forensic scientist  Date Charge Nurse/RN Notified 02/20/21  Time Charge Nurse/RN Notified 2045  Document  Progress note created (see row info) Yes  Patient not in acute distress, no change from admission symptoms.  Will continue to monitor.

## 2021-02-20 NOTE — Consult Note (Signed)
GI Inpatient Consult Note  Reason for Consult: Diarrhea, Rectal bleeding    Attending Requesting Consult: Dr. Mauro Kaufmann, MD  History of Present Illness: Bridget Woodard is a 70 y.o. female seen for evaluation of diarrhea, rectal bleeding at the request of Dr. Mauro Kaufmann, MD. Pt has a PMH of HTN, anxiety, Hx of SVT, rheumatoid arthritis, and DJD on chronic narcotics. Pt was admitted to [facility] last night via EMS after she had episode of vomiting, diarrhea, and then a reported syncopal episode while riding in the car with her husband. Upon presentation to the ED, she was tachycardic with HR 115 with otherwise normal vital signs. Blood work was significant for leukocytosis WBC 12K, creatinine 1.69, potassium 2.9, hemoglobin 11.9. EKG showed sinus tachycardia with nonspecific ST-T wave changes. Nonctrasted CT abd/pelvis showed fluid-filled small and large bowel consistent with diarrheal illness and probable enteritis. No bowel wall thickening or obstruction. GI panel and c diff PCR were negative. There were reports of two episodes of bright red blood per rectum. GI was consulted in context of hematochezia and diarrhea.   Patient seen and examined this afternoon resting in hospital bed. She reports her abdomen hurts all over, but points to her suprapubic and LLQ mostly. She rates pain 7/10 in severity. Pain described as crampy and aching. She reports having a BM doesn't seem to make the pain any better. She reports she has been passing bright red blood per rectum but is unable to quantify it for me. There has been one bowel movement today. She reports last week she was having constipation with straining on the toilet. She has seen [facility] previously which she attributed to hemorrhoidal disease. Hemoglobin this morning was 11.2. She denies any fevers, chills, sick contacts, or new medications. No sick contacts at home. She does take immunosuppressive medications at home for her hx of RA - leflunomide and methotrexate.  Last colonoscopy was reportedly 2 years ago but procedure report not available for review. She does report she had history of diverticulosis but doesn't recall if she ever had polyps.    Past Medical History:  Past Medical History:  Diagnosis Date  . Degenerative joint disease   . Hypertension   . Microcytic anemia   . Mitral valve prolapse   . RA (rheumatoid arthritis) (HCC)     Problem List: Patient Active Problem List   Diagnosis Date Noted  . Acute gastroenteritis 02/20/2021  . AKI (acute kidney injury) (HCC) 02/20/2021  . Hypokalemia 02/20/2021  . Hypomagnesemia 02/20/2021  . Syncope and collapse 02/20/2021  . Chronic narcotic use 02/20/2021  . Generalized anxiety disorder 09/02/2020  . 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 Rheumatoid arthritis (HCC) 01/09/2019  . Chronic pain syndrome 03/09/2016  . Essential hypertension 09/14/2011  . History of supraventricular tachycardia 09/14/2011    Past Surgical History: Past Surgical History:  Procedure Laterality Date  . ABDOMINAL HYSTERECTOMY    . CHOLECYSTECTOMY    . ELBOW SURGERY    . FOOT SURGERY    . KNEE SURGERY    . SHOULDER SURGERY    . TOTAL HIP ARTHROPLASTY      Allergies: Allergies  Allergen Reactions  . Aspirin   . Darvon [Propoxyphene]   . Iodine   . Percocet [Oxycodone-Acetaminophen]   . Shellfish Allergy   . Sulfa Antibiotics     Home Medications: Medications Prior to Admission  Medication Sig Dispense Refill Last Dose  . cephALEXin (KEFLEX) 500 MG capsule Take 1 capsule (  500 mg total) by mouth 4 (four) times daily. 40 capsule 0    Home medication reconciliation was completed with the patient.   Scheduled Inpatient Medications:   . enoxaparin (LOVENOX) injection  40 mg Subcutaneous Q24H  . pantoprazole (PROTONIX) IV  40 mg Intravenous Q24H  . sertraline  150 mg Oral Daily    Continuous Inpatient Infusions:   . lactated ringers 150 mL/hr at  02/20/21 0421  . promethazine (PHENERGAN) injection      PRN Inpatient Medications:  acetaminophen **OR** acetaminophen, amitriptyline, morphine injection, ondansetron **OR** ondansetron (ZOFRAN) IV, promethazine (PHENERGAN) injection  Family History: family history is not on file.  The patient's family history is negative for inflammatory bowel disorders, GI malignancy, or solid organ transplantation.  Social History:   reports that she has never smoked. She does not have any smokeless tobacco history on file. She reports that she does not drink alcohol and does not use drugs. The patient denies ETOH, tobacco, or drug use.   Review of Systems: Constitutional: Weight is stable.  Eyes: No changes in vision. ENT: No oral lesions, sore throat.  GI: see HPI.  Heme/Lymph: No easy bruising.  CV: No chest pain.  GU: No hematuria.  Integumentary: No rashes.  Neuro: No headaches.  Psych: No depression/anxiety.  Endocrine: No heat/cold intolerance.  Allergic/Immunologic: No urticaria.  Resp: No cough, SOB.  Musculoskeletal: No joint swelling.    Physical Examination: BP (!) 149/74 (BP Location: Left Arm)   Pulse (!) 121   Temp 98.6 F (37 C) (Oral)   Resp 18   Ht 5\' 6"  (1.676 m)   Wt 75.6 kg   SpO2 100%   BMI 26.91 kg/m   Non-toxic appearing female lying in hospital bed. Answers all questions.  Gen: NAD, alert and oriented x 4 HEENT: PEERLA, EOMI, Neck: supple, no JVD or thyromegaly Chest: CTA bilaterally, no wheezes, crackles, or other adventitious sounds CV: RRR, no m/g/c/r Abd: soft, ND, hypoactive BS in all four quadrants; tender to deep palpation in periumbilical and suprapubic regions, no HSM, guarding, ridigity, or rebound tenderness Ext: no edema, well perfused with 2+ pulses, Skin: no rash or lesions noted Lymph: no LAD  Data: Lab Results  Component Value Date   WBC 27.9 (H) 02/20/2021   HGB 11.2 (L) 02/20/2021   HCT 37.4 02/20/2021   MCV 83.7 02/20/2021   PLT  408 (H) 02/20/2021   Recent Labs  Lab 02/19/21 2028 02/20/21 0414  HGB 11.9* 11.2*   Lab Results  Component Value Date   NA 139 02/20/2021   K 4.1 02/20/2021   CL 106 02/20/2021   CO2 22 02/20/2021   BUN 23 02/20/2021   CREATININE 1.34 (H) 02/20/2021   CREATININE 1.33 (H) 02/20/2021   Lab Results  Component Value Date   ALT 20 02/19/2021   AST 34 02/19/2021   ALKPHOS 111 02/19/2021   BILITOT 0.6 02/19/2021   No results for input(s): APTT, INR, PTT in the last 168 hours.   CT abd/pelvis wo contrast 02/19/2021: IMPRESSION: 1. Fluid-filled small and large bowel, consistent with a diarrheal illness and probable enteritis. No acute bowel wall thickening. No obstruction. 2. Intra and extrahepatic biliary ductal dilatation post cholecystectomy. Recommend correlation with LFTs. 3. Pulmonary fibrosis at the lung bases.  Assessment/Plan:  70 y/o AA female with a PMH of HTN, anxiety, Hx of SVT, rheumatoid arthritis, and DJD on chronic narcotics admitted to [facility] last night for chief complaint of nausea, vomiting, and diarrhea and subsequent  syncopal episode. GI consulted due to concerns of bright red blood per rectum.  1. Acute gastroenteritis - report of nausea, vomiting, and diarrhea illness. GI PCR and c diff PCR negative. Pt is on supportive care with IV fluid hydration, IV antiemetics, and IV Protonix for gastric protection.  2. Hematochezia/Lower GI Bleed - hemodynamically insignificant bleeding. Hemoglobin 11.2 today. Possibly a diverticular bleed versus anal outlet etiology from internal hemorrhoids. Differential also includes stercoral changes, anal fissure, ischemic colitis, colon polyps, AVM, malignancy.  3. Syncopal episode - prior to hospitalization, likely 2/2 #1  4. AKI - likely prerenal in etiology, improving on IVF hydration  5. Hypokalemia - resolved s/p IV and oral repletion, 4.1 this am  COVID-19 Test:   NEGATIVE  Recommendations:  1. Maintain 2 large bore  IVs 2. Continue supportive care with IV fluid hydration, IV antiemetics, and IV Protonix for gastric protection 3. Continue to closely monitor H&H 4. Continue serial abdominal examinations 5. No plans for any urgent colonoscopy given lack of significant hemodynamic changes or overt bleeding 6. Agree with current management 7. GI will sign off for now and follow peripherally.   If there is evidence of overt bleeding or worsening diarrhea, please call Dr. Norma Fredrickson.   Thank you for the consult. Please call with questions or concerns.  Mickle Mallory [facility] [phone] (Cell)

## 2021-02-20 NOTE — Progress Notes (Addendum)
Subjective: Patient admitted this morning, see detailed H&P by Dr Para March 70 year old female with medical history of hypertension, anxiety, history of SVT, rheumatoid arthritis, DJD on chronic narcotics came to ED with 1 day history of several episodes of vomiting and diarrhea and passing out episode.  CT abdomen/pelvis showed diarrheal illness and probable enteritis.  GI stool pathogen panel was negative.  C. difficile PCR was negative.  Patient also had bright red blood per rectum.  Vitals:   02/20/21 0947 02/20/21 1119  BP: (!) 169/82 (!) 149/74  Pulse: (!) 113 (!) 121  Resp: 18 18  Temp: 98.5 F (36.9 C) 98.6 F (37 C)  SpO2: 100% 100%      A/P Acute gastroenteritis-CT abdomen/pelvis shows enteritis, patient started on supportive care with IV hydration, IV antiemetics, IV Protonix.  Syncope-likely from gastroenteritis.  Continue to monitor on telemetry.  Acute kidney injury-creatinine was 1.69 on presentation, baseline 1.0.  Likely prerenal azotemia.  Today creatinine is 1.33.  Follow BMP in am.  Hematochezia-patient states that she had colonoscopy 2 years ago at that time she was told that she has diverticulosis.  She had 2 episodes of bright red blood per rectum.  Will consult gastroenterology for further management.  Hemoglobin is stable at 11.2.  Hypokalemia-replete, potassium was 4.1 this morning.  Hypertension-lisinopril is on hold due to acute kidney injury.    Meredeth Ide [facility] [phone]

## 2021-02-20 NOTE — H&P (Signed)
History and Physical    Bridget Woodard PIR:[id number] DOB: 02-Jan-1951 DOA: 02/19/2021  PCP: Patient, No Pcp Per (Inactive)   Patient coming from: Home  I have personally briefly reviewed patient's old medical records in [facility]  Chief Complaint: Diarrhea, vomiting, passed out  HPI: Bridget Woodard is a 70 y.o. 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  Has associated mild colicky abdominal pain, with no aggravating or alleviating factors.,  Denies fever or chills.  No affected contacts and no offending foods.  History limited due to lethargy. ED course: On arrival BP 114/65, pulse 115, temp 97.6, O2 sat 99% on room air.  Blood work significant for leukocytosis of 12,000, creatinine of 1.69, potassium 2.9.  Troponin normal at 14.  Urinalysis with large leukocyte esterase.  GI panel and stool for C. difficile pending EKG, personally reviewed and interpreted: Sinus tachycardia at 113 with nonspecific ST-T wave changes Imaging: CT abdomen and pelvis consistent with diarrheal illness and probable enteritis CT head and C-spine no acute findings  Patient was hydrated in the emergency room, given potassium and magnesium repletion.  Hospitalist consulted for admission.    Review of Systems: As per HPI otherwise all other systems on review of systems negative.    Past Medical History:  Diagnosis Date  . Degenerative joint disease   . Hypertension   . Microcytic anemia   . Mitral valve prolapse   . RA (rheumatoid arthritis) (HCC)     Past Surgical History:  Procedure Laterality Date  . ABDOMINAL HYSTERECTOMY    . CHOLECYSTECTOMY    . ELBOW SURGERY    . FOOT SURGERY    . KNEE SURGERY    . SHOULDER SURGERY    . TOTAL HIP ARTHROPLASTY       reports that she has never smoked.  She does not have any smokeless tobacco history on file. She reports that she does not drink alcohol and does not use drugs.  Allergies  Allergen Reactions  . Aspirin   . Darvon [Propoxyphene]   . Iodine   . Percocet [Oxycodone-Acetaminophen]   . Shellfish Allergy   . Sulfa Antibiotics     History reviewed. No pertinent family history.    Prior to Admission medications   Medication Sig Start Date End Date Taking? Authorizing Provider  cephALEXin (KEFLEX) 500 MG capsule Take 1 capsule (500 mg total) by mouth 4 (four) times daily. 09/14/14   Emilia Beck, PA-C    Physical Exam: Vitals:   02/19/21 2017 02/19/21 2248 02/20/21 0145 02/20/21 0146  BP: 114/65 123/75 (!) 143/76 (!) 147/79  Pulse: (!) 115 97 (!) 106 (!) 105  Resp:  (!) 21 19 (!) 22  Temp: 97.6 F (36.4 C)     TempSrc: Oral     SpO2: 99% 100% 100% 100%  Weight:      Height:         Vitals:   02/19/21 2017 02/19/21 2248 02/20/21 0145 02/20/21 0146  BP: 114/65 123/75 (!) 143/76 (!) 147/79  Pulse: (!) 115 97 (!) 106 (!) 105  Resp:  (!) 21 19 (!) 22  Temp: 97.6 F (36.4 C)     TempSrc: Oral     SpO2: 99% 100% 100% 100%  Weight:      Height:  Constitutional:  Weak and ill-appearing, oriented x 3 . Not in any apparent distress HEENT:      Head: Normocephalic and atraumatic.         Eyes: PERLA, EOMI, Conjunctivae are normal. Sclera is non-icteric.       Mouth/Throat: Mucous membranes are moist.       Neck: Supple with no signs of meningismus. Cardiovascular:  Tachycardic. No murmurs, gallops, or rubs. 2+ symmetrical distal pulses are present . No JVD. No LE edema Respiratory: Respiratory effort normal .Lungs sounds clear bilaterally. No wheezes, crackles, or rhonchi.  Gastrointestinal: Soft, periumbilical tendernessr, and non distended with positive bowel sounds.  Genitourinary: No CVA tenderness. Musculoskeletal: Nontender with normal range of motion in all extremities. No cyanosis, or  erythema of extremities. Neurologic:  Face is symmetric. Moving all extremities. No gross focal neurologic deficits . Skin: Skin is warm, dry.  No rash or ulcers Psychiatric: Mood and affect are normal    Labs on Admission: I have personally reviewed following labs and imaging studies  CBC: Recent Labs  Lab 02/19/21 2028  WBC 12.3*  NEUTROABS 4.3  HGB 11.9*  HCT 39.4  MCV 81.7  PLT 506*   Basic Metabolic Panel: Recent Labs  Lab 02/19/21 2028  NA 139  K 2.9*  CL 102  CO2 25  GLUCOSE 174*  BUN 25*  CREATININE 1.69*  CALCIUM 9.2  MG 1.7   GFR: Estimated Creatinine Clearance: 33.2 mL/min (A) (by C-G formula based on SCr of 1.69 mg/dL (H)). Liver Function Tests: Recent Labs  Lab 02/19/21 2028  AST 34  ALT 20  ALKPHOS 111  BILITOT 0.6  PROT 8.4*  ALBUMIN 4.1   Recent Labs  Lab 02/19/21 2028  LIPASE 33   No results for input(s): AMMONIA in the last 168 hours. Coagulation Profile: No results for input(s): INR, PROTIME in the last 168 hours. Cardiac Enzymes: No results for input(s): CKTOTAL, CKMB, CKMBINDEX, TROPONINI in the last 168 hours. BNP (last 3 results) No results for input(s): PROBNP in the last 8760 hours. HbA1C: No results for input(s): HGBA1C in the last 72 hours. CBG: No results for input(s): GLUCAP in the last 168 hours. Lipid Profile: No results for input(s): CHOL, HDL, LDLCALC, TRIG, CHOLHDL, LDLDIRECT in the last 72 hours. Thyroid Function Tests: No results for input(s): TSH, T4TOTAL, FREET4, T3FREE, THYROIDAB in the last 72 hours. Anemia Panel: No results for input(s): VITAMINB12, FOLATE, FERRITIN, TIBC, IRON, RETICCTPCT in the last 72 hours. Urine analysis:    Component Value Date/Time   COLORURINE YELLOW (A) 02/19/2021 2028   APPEARANCEUR CLEAR (A) 02/19/2021 2028   LABSPEC 1.018 02/19/2021 2028   PHURINE 5.0 02/19/2021 2028   GLUCOSEU NEGATIVE 02/19/2021 2028   HGBUR NEGATIVE 02/19/2021 2028   BILIRUBINUR NEGATIVE 02/19/2021  2028   KETONESUR NEGATIVE 02/19/2021 2028   PROTEINUR NEGATIVE 02/19/2021 2028   NITRITE NEGATIVE 02/19/2021 2028   LEUKOCYTESUR LARGE (A) 02/19/2021 2028    Radiological Exams on Admission: CT ABDOMEN PELVIS WO CONTRAST  Result Date: 02/19/2021 CLINICAL DATA:  Vomiting and diarrhea EXAM: CT ABDOMEN AND PELVIS WITHOUT CONTRAST TECHNIQUE: Multidetector CT imaging of the abdomen and pelvis was performed following the standard protocol without IV contrast. COMPARISON:  None. FINDINGS: Lower chest: Lung bases demonstrate lower lobe bronchiectasis and pulmonary fibrosis. Mild fibrosis in the right middle lobe and lingula. No acute airspace disease. Hepatobiliary: Status post cholecystectomy. Calcified granuloma in the liver. Intra and extrahepatic biliary ductal dilatation, common bile duct diameter up to 12  mm. Pancreas: Unremarkable. No pancreatic ductal dilatation or surrounding inflammatory changes. Spleen: Normal in size without focal abnormality. Adrenals/Urinary Tract: Adrenal glands are normal. Kidneys show no hydronephrosis. The urinary bladder is partially obscured by artifact Stomach/Bowel: Stomach nonenlarged. Fluid-filled nondistended pelvic small bowel loops. Diffuse fluid within the colon. No acute bowel wall thickening. Negative appendix. Vascular/Lymphatic: Nonaneurysmal aorta.  No suspicious nodes Reproductive: Status post hysterectomy. No adnexal masses. Other: No free air or free fluid Musculoskeletal: Fatty atrophy of left iliopsoas muscle. Left hip replacement with artifact. No acute or suspicious osseous abnormality. IMPRESSION: 1. Fluid-filled small and large bowel, consistent with a diarrheal illness and probable enteritis. No acute bowel wall thickening. No obstruction. 2. Intra and extrahepatic biliary ductal dilatation post cholecystectomy. Recommend correlation with LFTs. 3. Pulmonary fibrosis at the lung bases. Electronically Signed   By: Jasmine Pang M.D.   On: 02/19/2021 21:39    CT Head Wo Contrast  Result Date: 02/19/2021 CLINICAL DATA:  Episode of emesis and diarrhea with syncope EXAM: CT HEAD WITHOUT CONTRAST CT CERVICAL SPINE WITHOUT CONTRAST TECHNIQUE: Multidetector CT imaging of the head and cervical spine was performed following the standard protocol without intravenous contrast. Multiplanar CT image reconstructions of the cervical spine were also generated. COMPARISON:  None. FINDINGS: CT HEAD FINDINGS Brain: No evidence of acute infarction, hemorrhage, hydrocephalus, extra-axial collection, visible mass lesion or mass effect. Symmetric prominence of the ventricles, cisterns and sulci compatible with parenchymal volume loss. Patchy areas of white matter hypoattenuation are most compatible with chronic microvascular angiopathy. Partially empty appearance of the sella. Remaining midline intracranial structures are unremarkable. Cerebellar tonsils are normally positioned. Scattered benign dural calcifications. Vascular: Atherosclerotic calcification of the carotid siphons. No hyperdense vessel. Skull: No calvarial fracture or suspicious osseous lesion. No scalp swelling or hematoma. Hyperostosis frontalis interna, a typically benign incidental finding. Sinuses/Orbits: Paranasal sinuses and mastoid air cells are predominantly clear. Included orbital structures are unremarkable. Other: Debris noted in the bilateral external auditory canals. Edentulous with dental prostheses. Mild bilateral TMJ arthrosis. CT CERVICAL SPINE FINDINGS Alignment: Stabilization collar is absent at the time of exam. There is notable cervical flexion. Likely contributing to the reversal the normal cervical lordosis. No evidence of traumatic listhesis. No abnormally widened, perched or jumped facets. Normal alignment of the craniocervical and atlantoaxial articulations. Skull base and vertebrae: No acute skull base fracture. No vertebral body fracture or height loss. Normal bone mineralization. No worrisome  osseous lesions. Multilevel cervical spondylitic changes as below. Mild arthrosis at the atlantodental interval with some spurring about the anterior arch C1. Soft tissues and spinal canal: No pre or paravertebral fluid or swelling. No visible canal hematoma. Disc levels: Multilevel intervertebral disc height loss with spondylitic endplate changes. Discogenic spurring is most pronounced anteriorly albeit with some shallow posterior disc osteophyte complexes in addition to uncinate spurring and facet hypertrophic changes. Some partial effacement of the ventral thecal sac is noted C4-C7 resulting in mild canal stenosis. At least mild bilateral neural foraminal narrowing across these levels as well. Upper chest: No acute abnormality in the upper chest or imaged lung apices. Other: No concerning thyroid nodules or masses. IMPRESSION: 1. No acute intracranial abnormality. 2. Mild parenchymal volume loss and chronic microvascular ischemic white matter disease. 3. Partially empty appearance of the sella, a nonspecific finding. 4. Debris in the bilateral external auditory canals, correlate for cerumen impaction. 5. No acute fracture or traumatic listhesis of the cervical spine. 6. Multilevel cervical spondylitic and facet degenerative changes, most pronounced C4-C7 where there  is mild canal stenosis neural foraminal narrowing. Electronically Signed   By: Kreg Shropshire M.D.   On: 02/19/2021 21:40   CT Cervical Spine Wo Contrast  Result Date: 02/19/2021 CLINICAL DATA:  Episode of emesis and diarrhea with syncope EXAM: CT HEAD WITHOUT CONTRAST CT CERVICAL SPINE WITHOUT CONTRAST TECHNIQUE: Multidetector CT imaging of the head and cervical spine was performed following the standard protocol without intravenous contrast. Multiplanar CT image reconstructions of the cervical spine were also generated. COMPARISON:  None. FINDINGS: CT HEAD FINDINGS Brain: No evidence of acute infarction, hemorrhage, hydrocephalus, extra-axial  collection, visible mass lesion or mass effect. Symmetric prominence of the ventricles, cisterns and sulci compatible with parenchymal volume loss. Patchy areas of white matter hypoattenuation are most compatible with chronic microvascular angiopathy. Partially empty appearance of the sella. Remaining midline intracranial structures are unremarkable. Cerebellar tonsils are normally positioned. Scattered benign dural calcifications. Vascular: Atherosclerotic calcification of the carotid siphons. No hyperdense vessel. Skull: No calvarial fracture or suspicious osseous lesion. No scalp swelling or hematoma. Hyperostosis frontalis interna, a typically benign incidental finding. Sinuses/Orbits: Paranasal sinuses and mastoid air cells are predominantly clear. Included orbital structures are unremarkable. Other: Debris noted in the bilateral external auditory canals. Edentulous with dental prostheses. Mild bilateral TMJ arthrosis. CT CERVICAL SPINE FINDINGS Alignment: Stabilization collar is absent at the time of exam. There is notable cervical flexion. Likely contributing to the reversal the normal cervical lordosis. No evidence of traumatic listhesis. No abnormally widened, perched or jumped facets. Normal alignment of the craniocervical and atlantoaxial articulations. Skull base and vertebrae: No acute skull base fracture. No vertebral body fracture or height loss. Normal bone mineralization. No worrisome osseous lesions. Multilevel cervical spondylitic changes as below. Mild arthrosis at the atlantodental interval with some spurring about the anterior arch C1. Soft tissues and spinal canal: No pre or paravertebral fluid or swelling. No visible canal hematoma. Disc levels: Multilevel intervertebral disc height loss with spondylitic endplate changes. Discogenic spurring is most pronounced anteriorly albeit with some shallow posterior disc osteophyte complexes in addition to uncinate spurring and facet hypertrophic  changes. Some partial effacement of the ventral thecal sac is noted C4-C7 resulting in mild canal stenosis. At least mild bilateral neural foraminal narrowing across these levels as well. Upper chest: No acute abnormality in the upper chest or imaged lung apices. Other: No concerning thyroid nodules or masses. IMPRESSION: 1. No acute intracranial abnormality. 2. Mild parenchymal volume loss and chronic microvascular ischemic white matter disease. 3. Partially empty appearance of the sella, a nonspecific finding. 4. Debris in the bilateral external auditory canals, correlate for cerumen impaction. 5. No acute fracture or traumatic listhesis of the cervical spine. 6. Multilevel cervical spondylitic and facet degenerative changes, most pronounced C4-C7 where there is mild canal stenosis neural foraminal narrowing. Electronically Signed   By: Kreg Shropshire M.D.   On: 02/19/2021 21:40   DG Chest Portable 1 View  Result Date: 02/19/2021 CLINICAL DATA:  Shortness of breath, vomiting, and diarrhea. Then passed out. EXAM: PORTABLE CHEST 1 VIEW COMPARISON:  None. FINDINGS: Shallow inspiration. Coarse linear and alveolar infiltrates in both lung bases, greater on the right, with evidence of bronchiectasis and bronchial wall thickening. Changes could represent chronic fibrosis, superimposed pneumonia/atelectasis, or aspiration. Heart size and pulmonary vascularity are normal. Mediastinal contours appear intact. Degenerative changes and postoperative change in the right shoulder. IMPRESSION: Coarse linear and alveolar infiltrates in both lung bases with evidence of bronchiectasis and bronchial wall thickening. Electronically Signed   By: Chrissie Noa  Andria Meuse M.D.   On: 02/19/2021 21:41     Assessment/Plan 70 year old female with history of HTN, anxiety, history of SVT, rheumatoid arthritis and DJD on chronic narcotics who presents to the emergency room with a 1 day history of several episodes of vomiting and diarrhea and a  passing out episode.       Acute gastroenteritis -1 day of vomiting and diarrhea with abdominal CT enteritis -IV hydration, IV antiemetics, IV Protonix -Replete electrolytes -N.p.o. except for ice chips to allow for bowel rest and tested clear liquid diet within a few hours -Follow stool studies -Enteric precautions    AKI (acute kidney injury) (HCC) -Creatinine 1.69, up from baseline of 1 a couple weeks prior -Likely ATN related to dehydration from GI losses -IV hydration -Monitor renal function and avoid nephrotoxins    Hypokalemia   Hypomagnesemia -Potassium 2.9 -Repleted with oral and IV potassium and IV magnesium in the emergency room -Continue to monitor and replete as necessary  Syncope and collapse -Most likely secondary to orthostatic hypotension from volume depletion related to GI losses -Continuous cardiac monitoring -Head and C-spine CTs with no acute injury -Continue IV hydration    Essential hypertension -Hold lisinopril due to AKI -Labetalol IV as needed    Generalized anxiety disorder -Continue sertraline, amitriptyline  Rheumatoid arthritis and DJD (HCC)   Immunodeficiency due immunosuppressive medication   Chronic pain with narcotic use -We will hold leflunomide, methotrexate for now -Continue hydromorphone as needed    DVT prophylaxis: Lovenox  Code Status: full code  Family Communication:  none  Disposition Plan: Back to previous home environment Consults called: none  Status: Observation    Andris Baumann MD [facility]     02/20/2021, 3:27 AM

## 2021-02-20 NOTE — ED Notes (Signed)
Hospitalist at bedside 

## 2021-02-21 DIAGNOSIS — N179 Acute kidney failure, unspecified: Secondary | ICD-10-CM

## 2021-02-21 DIAGNOSIS — I10 Essential (primary) hypertension: Secondary | ICD-10-CM

## 2021-02-21 LAB — BASIC METABOLIC PANEL
Anion gap: 10 (ref 5–15)
BUN: 12 mg/dL (ref 8–23)
CO2: 24 mmol/L (ref 22–32)
Calcium: 8.7 mg/dL — ABNORMAL LOW (ref 8.9–10.3)
Chloride: 106 mmol/L (ref 98–111)
Creatinine, Ser: 0.88 mg/dL (ref 0.44–1.00)
GFR, Estimated: >60 mL/min (ref >60)
Glucose, Bld: 126 mg/dL — ABNORMAL HIGH (ref 70–99)
Potassium: 4 mmol/L (ref 3.5–5.1)
Sodium: 140 mmol/L (ref 135–145)

## 2021-02-21 LAB — URINE CULTURE

## 2021-02-21 LAB — CBC
HCT: 38.6 % (ref 36.0–46.0)
Hemoglobin: 11.8 g/dL — ABNORMAL LOW (ref 12.0–15.0)
MCH: 25.4 pg — ABNORMAL LOW (ref 26.0–34.0)
MCHC: 30.6 g/dL (ref 30.0–36.0)
MCV: 83 fL (ref 80.0–100.0)
Platelets: 334 10*3/uL (ref 150–400)
RBC: 4.65 MIL/uL (ref 3.87–5.11)
RDW: 15.7 % — ABNORMAL HIGH (ref 11.5–15.5)
WBC: 29.8 10*3/uL — ABNORMAL HIGH (ref 4.0–10.5)
nRBC: 0 % (ref 0.0–0.2)

## 2021-02-21 MED ORDER — HYDROCODONE-ACETAMINOPHEN 5-325 MG PO TABS
1.0000 | ORAL_TABLET | Freq: Once | ORAL | Status: AC
Start: 2021-02-21 — End: 2021-02-21
  Administered 2021-02-21: 1 via ORAL
  Filled 2021-02-21: qty 1

## 2021-02-21 MED ORDER — METRONIDAZOLE 500 MG PO TABS
500.0000 mg | ORAL_TABLET | Freq: Three times a day (TID) | ORAL | Status: DC
  Administered 2021-02-21 – 2021-02-22 (×4): 500 mg via ORAL
  Filled 2021-02-21 (×5): qty 1

## 2021-02-21 MED ORDER — CIPROFLOXACIN HCL 500 MG PO TABS
500.0000 mg | ORAL_TABLET | Freq: Two times a day (BID) | ORAL | Status: DC
Start: 2021-02-21 — End: 2021-02-22
  Administered 2021-02-21 (×2): 500 mg via ORAL
  Filled 2021-02-21 (×3): qty 1

## 2021-02-21 NOTE — Progress Notes (Signed)
Pt c/o indigestion, treated with maalox per order/encouraged to sit up for meals and remain sitting for 53mins-1hr after meals. MD made aware/no new orders at this time.

## 2021-02-21 NOTE — Progress Notes (Signed)
Floor coverage note  Lost IV overnight. IV team unsuccessful. cipro and flagyl switched to po. Patient no longer vomiting.

## 2021-02-21 NOTE — Progress Notes (Addendum)
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  CT abdomen/pelvis showed diarrheal illness and probable enteritis.  GI stool pathogen panel was negative.  C. difficile PCR was negative.  Patient also had bright red blood per rectum.    Subjective   Patient seen and examined, feels better this morning.  She lost IV access yesterday so antibiotics were changed to p.o.  Currently on Cipro and Flagyl.  She still has intermittent episodes of rectal bleeding however hemoglobin has remained stable.  Gastroenterology was consulted and they signed off.  No intervention recommended at this time.   Assessment/Plan:     1. Diarrhea/enteritis-patient presented with diarrhea, improved with IV hydration, IV antiemetics.  She was also started on Cipro and Flagyl.  We will continue with antibiotics.  Continue IV LR at 100 mL/h. 2. Acute kidney injury-resolved, likely from ongoing diarrhea as above.  Creatinine has improved to 0.88 after IV fluids.  Follow BMP in am. 3. Rectal bleeding-patient having intermittent episodes of rectal bleeding.  Gastroenterology was consulted, they felt that it is either diverticular or rectal bleeding.  Hemoglobin has remained stable.  No further intervention recommended at this time.  We will continue to monitor patient's hemoglobin. 4. Syncope-patient had syncopal episode before she came to hospital.  Likely from dehydration from above.  Continue monitoring on telemetry. 5. Leukocytosis-unclear etiology, she continues to be afebrile.  Likely reactive.  Urine culture grew multiple species.  Will follow CBC in a.m. 6. Hypertension-lisinopril is on hold due to  acute kidney injury. 7. Hypokalemia-replete   Scheduled medications:   . ciprofloxacin  500 mg Oral BID  . enoxaparin (LOVENOX) injection  40 mg Subcutaneous Q24H  . metroNIDAZOLE  500 mg Oral Q8H  . pantoprazole (PROTONIX) IV  40 mg Intravenous Q24H  . sertraline  150 mg Oral Daily         Data Reviewed:   CBG:  No results for input(s): GLUCAP in the last 168 hours.  SpO2: 100 %    Vitals:   02/21/21 0135 02/21/21 0420 02/21/21 0757 02/21/21 1202  BP: (!) 163/82 (!) 150/84 (!) 154/79 (!) 147/82  Pulse: (!) 120 (!) 119 (!) 106 (!) 109  Resp: 16 16 16 16   Temp: 99.2 F (37.3 C) 99.2 F (37.3 C) 98.7 F (37.1 C) 98.5 F (36.9 C)  TempSrc: Oral Oral Oral   SpO2: 99% 98% 100% 100%  Weight:  73.9 kg    Height:         Intake/Output Summary (Last 24 hours) at 02/21/2021 1306 Last data filed at 02/20/2021 1700 Gross per 24 hour  Intake 1386.81 ml  Output --  Net 1386.81 ml    04/01 1901 - 04/03 0700 In: 3625.9 [I.V.:1386.8] Out: -   Filed Weights   02/19/21 2015 02/20/21 0947 02/21/21 0420  Weight: 76.2 kg 75.6 kg 73.9 kg    CBC:  Recent Labs  Lab 02/19/21 2028 02/20/21 0414 02/21/21 0520  WBC 12.3* 27.9* 29.8*  HGB 11.9* 11.2* 11.8*  HCT 39.4 37.4 38.6  PLT 506* 408* 334  MCV 81.7 83.7 83.0  MCH 24.7* 25.1* 25.4*  MCHC 30.2 29.9* 30.6  RDW 15.4 15.5 15.7*  LYMPHSABS  7.1*  --   --   MONOABS 0.4  --   --   EOSABS 0.4  --   --   BASOSABS 0.1  --   --     Complete metabolic panel:  Recent Labs  Lab 02/19/21 2028 02/20/21 0414 02/21/21 0520  NA 139 139 140  K 2.9* 4.1 4.0  CL 102 106 106  CO2 25 22 24   GLUCOSE 174* 156* 126*  BUN 25* 23 12  CREATININE 1.69* 1.34*  1.33* 0.88  CALCIUM 9.2 8.4* 8.7*  AST 34  --   --   ALT 20  --   --   ALKPHOS 111  --   --   BILITOT 0.6  --   --   ALBUMIN 4.1  --   --   MG 1.7 1.8  --     Recent Labs  Lab 02/19/21 2028  LIPASE 33    Recent Labs  Lab 02/19/21 2107  SARSCOV2NAA NEGATIVE     ------------------------------------------------------------------------------------------------------------------ No results for input(s): CHOL, HDL, LDLCALC, TRIG, CHOLHDL, LDLDIRECT in the last 72 hours.  No results found for: HGBA1C ------------------------------------------------------------------------------------------------------------------ No results for input(s): TSH, T4TOTAL, T3FREE, THYROIDAB in the last 72 hours.  Invalid input(s): FREET3 ------------------------------------------------------------------------------------------------------------------ No results for input(s): VITAMINB12, FOLATE, FERRITIN, TIBC, IRON, RETICCTPCT in the last 72 hours.  Coagulation profile  No results for input(s): INR, PROTIME in the last 168 hours.  No results for input(s): DDIMER in the last 72 hours.  Cardiac Enzymes  No results for input(s): CKMB, TROPONINI, MYOGLOBIN in the last 168 hours.  Invalid input(s): CK ------------------------------------------------------------------------------------------------------------------ No results found for: BNP   Antibiotics: Anti-infectives (From admission, onward)   Start     Dose/Rate Route Frequency Ordered Stop   02/21/21 0800  ciprofloxacin (CIPRO) tablet 500 mg        500 mg Oral 2 times daily 02/21/21 0126     02/21/21 0600  metroNIDAZOLE (FLAGYL) tablet 500 mg        500 mg Oral Every 8 hours 02/21/21 0126     02/20/21 2100  ciprofloxacin (CIPRO) IVPB 400 mg  Status:  Discontinued        400 mg 200 mL/hr over 60 Minutes Intravenous Every 12 hours 02/20/21 1858 02/21/21 0126   02/20/21 2000  metroNIDAZOLE (FLAGYL) IVPB 500 mg  Status:  Discontinued        500 mg 100 mL/hr over 60 Minutes Intravenous Every 8 hours 02/20/21 1858 02/21/21 0126       Radiology Reports  CT ABDOMEN PELVIS WO CONTRAST  Result Date: 02/19/2021 CLINICAL DATA:  Vomiting and diarrhea EXAM: CT ABDOMEN AND PELVIS WITHOUT CONTRAST TECHNIQUE:  Multidetector CT imaging of the abdomen and pelvis was performed following the standard protocol without IV contrast. COMPARISON:  None. FINDINGS: Lower chest: Lung bases demonstrate lower lobe bronchiectasis and pulmonary fibrosis. Mild fibrosis in the right middle lobe and lingula. No acute airspace disease. Hepatobiliary: Status post cholecystectomy. Calcified granuloma in the liver. Intra and extrahepatic biliary ductal dilatation, common bile duct diameter up to 12 mm. Pancreas: Unremarkable. No pancreatic ductal dilatation or surrounding inflammatory changes. Spleen: Normal in size without focal abnormality. Adrenals/Urinary Tract: Adrenal glands are normal. Kidneys show no hydronephrosis. The urinary bladder is partially obscured by artifact Stomach/Bowel: Stomach nonenlarged. Fluid-filled nondistended pelvic small bowel loops. Diffuse fluid within the colon. No acute bowel wall thickening. Negative appendix. Vascular/Lymphatic: Nonaneurysmal aorta.  No suspicious nodes Reproductive: Status post hysterectomy. No adnexal masses. Other: No free  air or free fluid Musculoskeletal: Fatty atrophy of left iliopsoas muscle. Left hip replacement with artifact. No acute or suspicious osseous abnormality. IMPRESSION: 1. Fluid-filled small and large bowel, consistent with a diarrheal illness and probable enteritis. No acute bowel wall thickening. No obstruction. 2. Intra and extrahepatic biliary ductal dilatation post cholecystectomy. Recommend correlation with LFTs. 3. Pulmonary fibrosis at the lung bases. Electronically Signed   By: Jasmine Pang M.D.   On: 02/19/2021 21:39   CT Head Wo Contrast  Result Date: 02/19/2021 CLINICAL DATA:  Episode of emesis and diarrhea with syncope EXAM: CT HEAD WITHOUT CONTRAST CT CERVICAL SPINE WITHOUT CONTRAST TECHNIQUE: Multidetector CT imaging of the head and cervical spine was performed following the standard protocol without intravenous contrast. Multiplanar CT image  reconstructions of the cervical spine were also generated. COMPARISON:  None. FINDINGS: CT HEAD FINDINGS Brain: No evidence of acute infarction, hemorrhage, hydrocephalus, extra-axial collection, visible mass lesion or mass effect. Symmetric prominence of the ventricles, cisterns and sulci compatible with parenchymal volume loss. Patchy areas of white matter hypoattenuation are most compatible with chronic microvascular angiopathy. Partially empty appearance of the sella. Remaining midline intracranial structures are unremarkable. Cerebellar tonsils are normally positioned. Scattered benign dural calcifications. Vascular: Atherosclerotic calcification of the carotid siphons. No hyperdense vessel. Skull: No calvarial fracture or suspicious osseous lesion. No scalp swelling or hematoma. Hyperostosis frontalis interna, a typically benign incidental finding. Sinuses/Orbits: Paranasal sinuses and mastoid air cells are predominantly clear. Included orbital structures are unremarkable. Other: Debris noted in the bilateral external auditory canals. Edentulous with dental prostheses. Mild bilateral TMJ arthrosis. CT CERVICAL SPINE FINDINGS Alignment: Stabilization collar is absent at the time of exam. There is notable cervical flexion. Likely contributing to the reversal the normal cervical lordosis. No evidence of traumatic listhesis. No abnormally widened, perched or jumped facets. Normal alignment of the craniocervical and atlantoaxial articulations. Skull base and vertebrae: No acute skull base fracture. No vertebral body fracture or height loss. Normal bone mineralization. No worrisome osseous lesions. Multilevel cervical spondylitic changes as below. Mild arthrosis at the atlantodental interval with some spurring about the anterior arch C1. Soft tissues and spinal canal: No pre or paravertebral fluid or swelling. No visible canal hematoma. Disc levels: Multilevel intervertebral disc height loss with spondylitic  endplate changes. Discogenic spurring is most pronounced anteriorly albeit with some shallow posterior disc osteophyte complexes in addition to uncinate spurring and facet hypertrophic changes. Some partial effacement of the ventral thecal sac is noted C4-C7 resulting in mild canal stenosis. At least mild bilateral neural foraminal narrowing across these levels as well. Upper chest: No acute abnormality in the upper chest or imaged lung apices. Other: No concerning thyroid nodules or masses. IMPRESSION: 1. No acute intracranial abnormality. 2. Mild parenchymal volume loss and chronic microvascular ischemic white matter disease. 3. Partially empty appearance of the sella, a nonspecific finding. 4. Debris in the bilateral external auditory canals, correlate for cerumen impaction. 5. No acute fracture or traumatic listhesis of the cervical spine. 6. Multilevel cervical spondylitic and facet degenerative changes, most pronounced C4-C7 where there is mild canal stenosis neural foraminal narrowing. Electronically Signed   By: Kreg Shropshire M.D.   On: 02/19/2021 21:40   CT Cervical Spine Wo Contrast  Result Date: 02/19/2021 CLINICAL DATA:  Episode of emesis and diarrhea with syncope EXAM: CT HEAD WITHOUT CONTRAST CT CERVICAL SPINE WITHOUT CONTRAST TECHNIQUE: Multidetector CT imaging of the head and cervical spine was performed following the standard protocol without intravenous contrast. Multiplanar CT image  reconstructions of the cervical spine were also generated. COMPARISON:  None. FINDINGS: CT HEAD FINDINGS Brain: No evidence of acute infarction, hemorrhage, hydrocephalus, extra-axial collection, visible mass lesion or mass effect. Symmetric prominence of the ventricles, cisterns and sulci compatible with parenchymal volume loss. Patchy areas of white matter hypoattenuation are most compatible with chronic microvascular angiopathy. Partially empty appearance of the sella. Remaining midline intracranial structures are  unremarkable. Cerebellar tonsils are normally positioned. Scattered benign dural calcifications. Vascular: Atherosclerotic calcification of the carotid siphons. No hyperdense vessel. Skull: No calvarial fracture or suspicious osseous lesion. No scalp swelling or hematoma. Hyperostosis frontalis interna, a typically benign incidental finding. Sinuses/Orbits: Paranasal sinuses and mastoid air cells are predominantly clear. Included orbital structures are unremarkable. Other: Debris noted in the bilateral external auditory canals. Edentulous with dental prostheses. Mild bilateral TMJ arthrosis. CT CERVICAL SPINE FINDINGS Alignment: Stabilization collar is absent at the time of exam. There is notable cervical flexion. Likely contributing to the reversal the normal cervical lordosis. No evidence of traumatic listhesis. No abnormally widened, perched or jumped facets. Normal alignment of the craniocervical and atlantoaxial articulations. Skull base and vertebrae: No acute skull base fracture. No vertebral body fracture or height loss. Normal bone mineralization. No worrisome osseous lesions. Multilevel cervical spondylitic changes as below. Mild arthrosis at the atlantodental interval with some spurring about the anterior arch C1. Soft tissues and spinal canal: No pre or paravertebral fluid or swelling. No visible canal hematoma. Disc levels: Multilevel intervertebral disc height loss with spondylitic endplate changes. Discogenic spurring is most pronounced anteriorly albeit with some shallow posterior disc osteophyte complexes in addition to uncinate spurring and facet hypertrophic changes. Some partial effacement of the ventral thecal sac is noted C4-C7 resulting in mild canal stenosis. At least mild bilateral neural foraminal narrowing across these levels as well. Upper chest: No acute abnormality in the upper chest or imaged lung apices. Other: No concerning thyroid nodules or masses. IMPRESSION: 1. No acute  intracranial abnormality. 2. Mild parenchymal volume loss and chronic microvascular ischemic white matter disease. 3. Partially empty appearance of the sella, a nonspecific finding. 4. Debris in the bilateral external auditory canals, correlate for cerumen impaction. 5. No acute fracture or traumatic listhesis of the cervical spine. 6. Multilevel cervical spondylitic and facet degenerative changes, most pronounced C4-C7 where there is mild canal stenosis neural foraminal narrowing. Electronically Signed   By: Kreg Shropshire M.D.   On: 02/19/2021 21:40   DG Chest Portable 1 View  Result Date: 02/19/2021 CLINICAL DATA:  Shortness of breath, vomiting, and diarrhea. Then passed out. EXAM: PORTABLE CHEST 1 VIEW COMPARISON:  None. FINDINGS: Shallow inspiration. Coarse linear and alveolar infiltrates in both lung bases, greater on the right, with evidence of bronchiectasis and bronchial wall thickening. Changes could represent chronic fibrosis, superimposed pneumonia/atelectasis, or aspiration. Heart size and pulmonary vascularity are normal. Mediastinal contours appear intact. Degenerative changes and postoperative change in the right shoulder. IMPRESSION: Coarse linear and alveolar infiltrates in both lung bases with evidence of bronchiectasis and bronchial wall thickening. Electronically Signed   By: Burman Nieves M.D.   On: 02/19/2021 21:41      DVT prophylaxis: Lovenox  Code Status: Full code  Family Communication: Discussed with patient husband on phone   Consultants:  Gastroenterology  Procedures:      Objective    Physical Examination:    General: Appears in no acute distress  Cardiovascular: S1-S2, regular, no murmur auscultated  Respiratory: Clear to auscultation bilaterally  Abdomen: Abdomen is soft,  mild distention noted, mild tender to palpation, no rigidity or guarding  Extremities: No edema in the lower extremities  Neurologic: Alert, oriented x3, no focal deficit  noted, intact insight and judgment   Status is: Inpatient  Dispo: The patient is from: Home              Anticipated d/c is to: Home              Anticipated d/c date is: 02/23/2021              Patient currently not medically stable for discharge  Barrier to discharge-ongoing evaluation for rectal bleeding, diarrhea, enteritis  COVID-19 Labs  No results for input(s): DDIMER, FERRITIN, LDH, CRP in the last 72 hours.  Lab Results  Component Value Date   SARSCOV2NAA NEGATIVE 02/19/2021    Microbiology  Recent Results (from the past 240 hour(s))  Urine culture     Status: Abnormal   Collection Time: 02/19/21  8:28 PM   Specimen: Urine, Random  Result Value Ref Range Status   Specimen Description   Final    URINE, RANDOM Performed at [facility], [address] [place], [place] [address]    Special Requests   Final    NONE Performed at [facility], [address] [place], [place] [address]    Culture MULTIPLE SPECIES PRESENT, SUGGEST RECOLLECTION (A)  Final   Report Status 02/21/2021 FINAL  Final  Resp Panel by RT-PCR (Flu A&B, Covid) Urine, Clean Catch     Status: None   Collection Time: 02/19/21  9:07 PM   Specimen: Urine, Clean Catch; Nasopharyngeal(NP) swabs in vial transport medium  Result Value Ref Range Status   SARS Coronavirus 2 by RT PCR NEGATIVE NEGATIVE Final    Comment: (NOTE) SARS-CoV-2 target nucleic acids are NOT DETECTED.  The SARS-CoV-2 RNA is generally detectable in upper respiratory specimens during the acute phase of infection. The lowest concentration of SARS-CoV-2 viral copies this assay can detect is 138 copies/mL. A negative result does not preclude SARS-Cov-2 infection and should not be used as the sole basis for treatment or other patient management decisions. A negative result may occur with  improper specimen collection/handling, submission of specimen other than nasopharyngeal swab, presence of viral mutation(s)  within the areas targeted by this assay, and inadequate number of viral copies(<138 copies/mL). A negative result must be combined with clinical observations, patient history, and epidemiological information. The expected result is Negative.  Fact Sheet for Patients:  BloggerCourse.com  Fact Sheet for Healthcare Providers:  SeriousBroker.it  This test is no t yet approved or cleared by the [place] FDA and  has been authorized for detection and/or diagnosis of SARS-CoV-2 by FDA under an Emergency Use Authorization (EUA). This EUA will remain  in effect (meaning this test can be used) for the duration of the COVID-19 declaration under Section 564(b)(1) of the Act, 21 U.S.C.section 360bbb-3(b)(1), unless the authorization is terminated  or revoked sooner.       Influenza A by PCR NEGATIVE NEGATIVE Final   Influenza B by PCR NEGATIVE NEGATIVE Final    Comment: (NOTE) The Xpert Xpress SARS-CoV-2/FLU/RSV plus assay is intended as an aid in the diagnosis of influenza from Nasopharyngeal swab specimens and should not be used as a sole basis for treatment. Nasal washings and aspirates are unacceptable for Xpert Xpress SARS-CoV-2/FLU/RSV testing.  Fact Sheet for Patients: BloggerCourse.com  Fact Sheet for Healthcare Providers: SeriousBroker.it  This test is not yet approved  or cleared by the [place] and has been authorized for detection and/or diagnosis of SARS-CoV-2 by FDA under an Emergency Use Authorization (EUA). This EUA will remain in effect (meaning this test can be used) for the duration of the COVID-19 declaration under Section 564(b)(1) of the Act, 21 U.S.C. section 360bbb-3(b)(1), unless the authorization is terminated or revoked.  Performed at [facility], [address] [place], [place] [address]   Gastrointestinal Panel by PCR , Stool      Status: None   Collection Time: 02/19/21 10:50 PM   Specimen: Stool  Result Value Ref Range Status   Campylobacter species NOT DETECTED NOT DETECTED Final   Plesimonas shigelloides NOT DETECTED NOT DETECTED Final   Salmonella species NOT DETECTED NOT DETECTED Final   Yersinia enterocolitica NOT DETECTED NOT DETECTED Final   Vibrio species NOT DETECTED NOT DETECTED Final   Vibrio cholerae NOT DETECTED NOT DETECTED Final   Enteroaggregative E coli (EAEC) NOT DETECTED NOT DETECTED Final   Enteropathogenic E coli (EPEC) NOT DETECTED NOT DETECTED Final   Enterotoxigenic E coli (ETEC) NOT DETECTED NOT DETECTED Final   Shiga like toxin producing E coli (STEC) NOT DETECTED NOT DETECTED Final   Shigella/Enteroinvasive E coli (EIEC) NOT DETECTED NOT DETECTED Final   Cryptosporidium NOT DETECTED NOT DETECTED Final   Cyclospora cayetanensis NOT DETECTED NOT DETECTED Final   Entamoeba histolytica NOT DETECTED NOT DETECTED Final   Giardia lamblia NOT DETECTED NOT DETECTED Final   Adenovirus F40/41 NOT DETECTED NOT DETECTED Final   Astrovirus NOT DETECTED NOT DETECTED Final   Norovirus GI/GII NOT DETECTED NOT DETECTED Final   Rotavirus A NOT DETECTED NOT DETECTED Final   Sapovirus (I, II, IV, and V) NOT DETECTED NOT DETECTED Final    Comment: Performed at [facility], [address] [place], [place] [address]  C Difficile Quick Screen w PCR reflex     Status: None   Collection Time: 02/19/21 10:50 PM   Specimen: Stool  Result Value Ref Range Status   C Diff antigen NEGATIVE NEGATIVE Final   C Diff toxin NEGATIVE NEGATIVE Final   C Diff interpretation No C. difficile detected.  Final    Comment: Performed at [facility], [address] [place], [place] [address]             Meredeth Ide   [facility] If 7PM-7AM, please contact night-coverage at [email or web address], Office  [phone]   02/21/2021, 1:06 PM  LOS: 1 day

## 2021-02-22 ENCOUNTER — Inpatient Hospital Stay: Payer: Medicare Other

## 2021-02-22 ENCOUNTER — Encounter: Payer: Self-pay | Admitting: Internal Medicine

## 2021-02-22 DIAGNOSIS — R197 Diarrhea, unspecified: Secondary | ICD-10-CM

## 2021-02-22 DIAGNOSIS — E860 Dehydration: Secondary | ICD-10-CM

## 2021-02-22 DIAGNOSIS — R112 Nausea with vomiting, unspecified: Secondary | ICD-10-CM

## 2021-02-22 LAB — COMPREHENSIVE METABOLIC PANEL
ALT: 12 U/L (ref 0–44)
AST: 15 U/L (ref 15–41)
Albumin: 3.2 g/dL — ABNORMAL LOW (ref 3.5–5.0)
Alkaline Phosphatase: 68 U/L (ref 38–126)
Anion gap: 7 (ref 5–15)
BUN: 9 mg/dL (ref 8–23)
CO2: 25 mmol/L (ref 22–32)
Calcium: 8.4 mg/dL — ABNORMAL LOW (ref 8.9–10.3)
Chloride: 107 mmol/L (ref 98–111)
Creatinine, Ser: 0.78 mg/dL (ref 0.44–1.00)
GFR, Estimated: >60 mL/min (ref >60)
Glucose, Bld: 111 mg/dL — ABNORMAL HIGH (ref 70–99)
Potassium: 3.9 mmol/L (ref 3.5–5.1)
Sodium: 139 mmol/L (ref 135–145)
Total Bilirubin: 0.7 mg/dL (ref 0.3–1.2)
Total Protein: 6.2 g/dL — ABNORMAL LOW (ref 6.5–8.1)

## 2021-02-22 LAB — CBC
HCT: 34.2 % — ABNORMAL LOW (ref 36.0–46.0)
Hemoglobin: 10.6 g/dL — ABNORMAL LOW (ref 12.0–15.0)
MCH: 25.2 pg — ABNORMAL LOW (ref 26.0–34.0)
MCHC: 31 g/dL (ref 30.0–36.0)
MCV: 81.2 fL (ref 80.0–100.0)
Platelets: 269 10*3/uL (ref 150–400)
RBC: 4.21 MIL/uL (ref 3.87–5.11)
RDW: 15.4 % (ref 11.5–15.5)
WBC: 27.9 10*3/uL — ABNORMAL HIGH (ref 4.0–10.5)
nRBC: 0 % (ref 0.0–0.2)

## 2021-02-22 MED ORDER — DIPHENHYDRAMINE HCL 25 MG PO CAPS
50.0000 mg | ORAL_CAPSULE | Freq: Once | ORAL | Status: AC
  Administered 2021-02-22: 50 mg via ORAL
  Filled 2021-02-22: qty 2

## 2021-02-22 MED ORDER — DIPHENHYDRAMINE HCL 25 MG PO CAPS: 50.0000 mg | ORAL_CAPSULE | Freq: Once | ORAL | Status: DC

## 2021-02-22 MED ORDER — ACETAMINOPHEN 650 MG RE SUPP: 650.0000 mg | Freq: Four times a day (QID) | RECTAL | Status: DC | PRN

## 2021-02-22 MED ORDER — ACETAMINOPHEN 325 MG PO TABS: 650.0000 mg | ORAL_TABLET | Freq: Four times a day (QID) | ORAL | Status: DC | PRN

## 2021-02-22 MED ORDER — DIPHENHYDRAMINE HCL 50 MG/ML IJ SOLN: 50.0000 mg | Freq: Once | INTRAMUSCULAR | Status: AC

## 2021-02-22 MED ORDER — DIPHENHYDRAMINE HCL 50 MG/ML IJ SOLN: 50.0000 mg | Freq: Once | INTRAMUSCULAR | Status: DC

## 2021-02-22 MED ORDER — DIPHENHYDRAMINE HCL 25 MG PO CAPS
50.0000 mg | ORAL_CAPSULE | Freq: Once | ORAL | Status: DC
  Filled 2021-02-22: qty 2

## 2021-02-22 MED ORDER — CIPROFLOXACIN IN D5W 400 MG/200ML IV SOLN
400.0000 mg | Freq: Two times a day (BID) | INTRAVENOUS | Status: DC
  Administered 2021-02-22 – 2021-02-24 (×5): 400 mg via INTRAVENOUS
  Filled 2021-02-22 (×6): qty 200

## 2021-02-22 MED ORDER — HYDROCORTISONE NA SUCCINATE PF 250 MG IJ SOLR
200.0000 mg | Freq: Once | INTRAMUSCULAR | Status: AC
  Administered 2021-02-22: 200 mg via INTRAVENOUS
  Filled 2021-02-22: qty 200

## 2021-02-22 MED ORDER — PREDNISONE 50 MG PO TABS
50.0000 mg | ORAL_TABLET | Freq: Four times a day (QID) | ORAL | Status: DC
Start: 2021-02-22 — End: 2021-02-22
  Filled 2021-02-22: qty 1

## 2021-02-22 MED ORDER — METRONIDAZOLE IN NACL 5-0.79 MG/ML-% IV SOLN
500.0000 mg | Freq: Three times a day (TID) | INTRAVENOUS | Status: DC
  Administered 2021-02-22 – 2021-02-24 (×7): 500 mg via INTRAVENOUS
  Filled 2021-02-22 (×9): qty 100

## 2021-02-22 MED ORDER — IOHEXOL 300 MG/ML  SOLN
100.0000 mL | Freq: Once | INTRAMUSCULAR | Status: AC | PRN
  Administered 2021-02-22: 100 mL via INTRAVENOUS

## 2021-02-22 NOTE — Progress Notes (Signed)
CT scan completed and personally reviewed.  No evidence of a surgical source for patient's symptoms.  Will sign off.

## 2021-02-22 NOTE — Progress Notes (Signed)
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  CT abdomen/pelvis showed diarrheal illness and probable enteritis.  GI stool pathogen panel was negative.  C. difficile PCR was negative.  Patient also had bright red blood per rectum.    Subjective   Patient seen and examined, continues to have 3-4 episodes of loose bowel movements every day along with GI bleed.  Also complains of persistent abdominal pain and distention.  WBC still elevated to 27,000.   Assessment/Plan:     1. Diarrhea/enteritis-patient presented with diarrhea, continues to have diarrhea with lower GI bleed.  She was started on Cipro and Flagyl.  Continue LR at 100 mL/h.  Since patient continues to have persistent abdominal pain along with abdominal distention.  I have consulted general surgery and also repeat CT scan of the abdomen/pelvis with contrast.  Earlier CT scan was done without contrast as patient has allergy to iodine.  I have called IR and will place contrast allergy protocol with IV Solu-Medrol 4 hours before CT abdomen/pelvis with contrast.  We will follow the result.   2. Acute kidney injury-resolved, likely from ongoing diarrhea as above.  Creatinine has improved to 0.88 after IV fluids.  Follow BMP in am. 3. Rectal bleeding-patient having intermittent episodes of rectal bleeding.  Gastroenterology was consulted, they felt that it is either diverticular or rectal bleeding.  Hemoglobin has remained stable.  No further intervention recommended at this time.  We will continue to monitor patient's hemoglobin. 4. Syncope-patient had syncopal episode before she came to hospital.  Likely from  dehydration from above.  Continue monitoring on telemetry. 5. Leukocytosis-unclear etiology, she continues to be afebrile.  Has persistent chronic pain.  Repeat CT abdomen/pelvis ordered..  Urine culture grew multiple species.  Will follow CBC in a.m. 6. Hypertension-lisinopril is on hold due to acute kidney injury. 7. Hypokalemia-replete   Scheduled medications:   . diphenhydrAMINE  50 mg Oral Once   Or  . diphenhydrAMINE  50 mg Intravenous Once  . diphenhydrAMINE  50 mg Oral Once   Or  . diphenhydrAMINE  50 mg Intravenous Once  . enoxaparin (LOVENOX) injection  40 mg Subcutaneous Q24H  . pantoprazole (PROTONIX) IV  40 mg Intravenous Q24H  . sertraline  150 mg Oral Daily         Data Reviewed:   CBG:  No results for input(s): GLUCAP in the last 168 hours.  SpO2: 100 %    Vitals:   02/22/21 0455 02/22/21 0809 02/22/21 1151 02/22/21 1537  BP: 138/77 131/86 (!) 143/79 (!) 142/73  Pulse: (!) 103 (!) 110 (!) 101 (!) 101  Resp: Temp: 98.7 F (37.1 C) 98.5 F (36.9 C) 98.9 F (37.2 C) 99.2 F (37.3 C)  TempSrc: Oral Oral Oral Oral  SpO2: 100% 100% 97% 100%  Weight:      Height:         Intake/Output Summary (Last 24 hours) at 02/22/2021 1637 Last data filed at 02/22/2021 1330 Gross per 24 hour  Intake 800 ml  Output --  Net 800 ml    04/02 1901 - 04/04 0700 In: 600 [P.O.:600] Out: -   [employer]  02/19/21 2015 02/20/21 0947 02/21/21 0420  Weight: 76.2 kg 75.6 kg 73.9 kg    CBC:  Recent Labs  Lab 02/19/21 2028 02/20/21 0414 02/21/21 0520 02/22/21 0448  WBC 12.3* 27.9* 29.8* 27.9*  HGB 11.9* 11.2* 11.8* 10.6*  HCT 39.4 37.4 38.6 34.2*  PLT 506* 408* 334 269  MCV 81.7 83.7 83.0 81.2  MCH 24.7* 25.1* 25.4* 25.2*  MCHC 30.2 29.9* 30.6 31.0  RDW 15.4 15.5 15.7* 15.4  LYMPHSABS 7.1*  --   --   --   MONOABS 0.4  --   --   --   EOSABS 0.4  --   --   --   BASOSABS 0.1  --   --   --     Complete metabolic panel:  Recent Labs  Lab  02/19/21 2028 02/20/21 0414 02/21/21 0520 02/22/21 0448  NA 139 139 140 139  K 2.9* 4.1 4.0 3.9  CL 102 106 106 107  CO2 25 22 24 25   GLUCOSE 174* 156* 126* 111*  BUN 25* 23 12 9   CREATININE 1.69* 1.34*  1.33* 0.88 0.78  CALCIUM 9.2 8.4* 8.7* 8.4*  AST 34  --   --  15  ALT 20  --   --  12  ALKPHOS 111  --   --  68  BILITOT 0.6  --   --  0.7  ALBUMIN 4.1  --   --  3.2*  MG 1.7 1.8  --   --     Recent Labs  Lab 02/19/21 2028  LIPASE 33    Recent Labs  Lab 02/19/21 2107  SARSCOV2NAA NEGATIVE    ------------------------------------------------------------------------------------------------------------------ No results for input(s): CHOL, HDL, LDLCALC, TRIG, CHOLHDL, LDLDIRECT in the last 72 hours.  No results found for: HGBA1C ------------------------------------------------------------------------------------------------------------------ No results for input(s): TSH, T4TOTAL, T3FREE, THYROIDAB in the last 72 hours.  Invalid input(s): FREET3 ------------------------------------------------------------------------------------------------------------------ No results for input(s): VITAMINB12, FOLATE, FERRITIN, TIBC, IRON, RETICCTPCT in the last 72 hours.  Coagulation profile  No results for input(s): INR, PROTIME in the last 168 hours.  No results for input(s): DDIMER in the last 72 hours.  Cardiac Enzymes  No results for input(s): CKMB, TROPONINI, MYOGLOBIN in the last 168 hours.  Invalid input(s): CK ------------------------------------------------------------------------------------------------------------------ No results found for: BNP   Antibiotics: Anti-infectives (From admission, onward)   Start     Dose/Rate Route Frequency Ordered Stop   02/22/21 1030  ciprofloxacin (CIPRO) IVPB 400 mg        400 mg 200 mL/hr over 60 Minutes Intravenous Every 12 hours 02/22/21 0939     02/22/21 1030  metroNIDAZOLE (FLAGYL) IVPB 500 mg        500 mg 100 mL/hr  over 60 Minutes Intravenous Every 8 hours 02/22/21 0939     02/21/21 0800  ciprofloxacin (CIPRO) tablet 500 mg  Status:  Discontinued        500 mg Oral 2 times daily 02/21/21 0126 02/22/21 0939   02/21/21 0600  metroNIDAZOLE (FLAGYL) tablet 500 mg  Status:  Discontinued        500 mg Oral Every 8 hours 02/21/21 0126 02/22/21 0939   02/20/21 2100  ciprofloxacin (CIPRO) IVPB 400 mg  Status:  Discontinued        400 mg 200 mL/hr over 60 Minutes Intravenous Every 12 hours 02/20/21 1858 02/21/21 0126   02/20/21 2000  metroNIDAZOLE (FLAGYL) IVPB 500 mg  Status:  Discontinued        500 mg 100 mL/hr over  60 Minutes Intravenous Every 8 hours 02/20/21 1858 02/21/21 0126       Radiology Reports  No results found.    DVT prophylaxis: Lovenox  Code Status: Full code  Family Communication: Discussed with patient husband on phone   Consultants:  Gastroenterology  Procedures:      Objective    Physical Examination:    General-appears in no acute distress  Heart-S1-S2, regular, no murmur auscultated  Lungs-clear to auscultation bilaterally, no wheezing or crackles auscultated  Abdomen-soft, mild abdominal distention, positive tenderness in left lower quadrant, no rigidity or guarding  Extremities-no edema in the lower extremities  Neuro-alert, oriented x3, no focal deficit noted   Status is: Inpatient  Dispo: The patient is from: Home              Anticipated d/c is to: Home              Anticipated d/c date is: 02/25/2021              Patient currently not medically stable for discharge  Barrier to discharge-ongoing evaluation for rectal bleeding, diarrhea, enteritis  COVID-19 Labs  No results for input(s): DDIMER, FERRITIN, LDH, CRP in the last 72 hours.  Lab Results  Component Value Date   SARSCOV2NAA NEGATIVE 02/19/2021    Microbiology  Recent Results (from the past 240 hour(s))  Urine culture     Status: Abnormal   Collection Time: 02/19/21  8:28 PM    Specimen: Urine, Random  Result Value Ref Range Status   Specimen Description   Final    URINE, RANDOM Performed at [facility], [address], [place], [place] [address]    Special Requests   Final    NONE Performed at [facility], [address] [place], [place] [address]    Culture MULTIPLE SPECIES PRESENT, SUGGEST RECOLLECTION (A)  Final   Report Status 02/21/2021 FINAL  Final  Resp Panel by RT-PCR (Flu A&B, Covid) Urine, Clean Catch     Status: None   Collection Time: 02/19/21  9:07 PM   Specimen: Urine, Clean Catch; Nasopharyngeal(NP) swabs in vial transport medium  Result Value Ref Range Status   SARS Coronavirus 2 by RT PCR NEGATIVE NEGATIVE Final    Comment: (NOTE) SARS-CoV-2 target nucleic acids are NOT DETECTED.  The SARS-CoV-2 RNA is generally detectable in upper respiratory specimens during the acute phase of infection. The lowest concentration of SARS-CoV-2 viral copies this assay can detect is 138 copies/mL. A negative result does not preclude SARS-Cov-2 infection and should not be used as the sole basis for treatment or other patient management decisions. A negative result may occur with  improper specimen collection/handling, submission of specimen other than nasopharyngeal swab, presence of viral mutation(s) within the areas targeted by this assay, and inadequate number of viral copies(<138 copies/mL). A negative result must be combined with clinical observations, patient history, and epidemiological information. The expected result is Negative.  Fact Sheet for Patients:  BloggerCourse.com  Fact Sheet for Healthcare Providers:  SeriousBroker.it  This test is no t yet approved or cleared by the [place] FDA and  has been authorized for detection and/or diagnosis of SARS-CoV-2 by FDA under an Emergency Use Authorization (EUA). This EUA will remain  in effect (meaning this test  can be used) for the duration of the COVID-19 declaration under Section 564(b)(1) of the Act, 21 U.S.C.section 360bbb-3(b)(1), unless the authorization is terminated  or revoked sooner.       Influenza  A by PCR NEGATIVE NEGATIVE Final   Influenza B by PCR NEGATIVE NEGATIVE Final    Comment: (NOTE) The Xpert Xpress SARS-CoV-2/FLU/RSV plus assay is intended as an aid in the diagnosis of influenza from Nasopharyngeal swab specimens and should not be used as a sole basis for treatment. Nasal washings and aspirates are unacceptable for Xpert Xpress SARS-CoV-2/FLU/RSV testing.  Fact Sheet for Patients: BloggerCourse.com  Fact Sheet for Healthcare Providers: SeriousBroker.it  This test is not yet approved or cleared by the [place] FDA and has been authorized for detection and/or diagnosis of SARS-CoV-2 by FDA under an Emergency Use Authorization (EUA). This EUA will remain in effect (meaning this test can be used) for the duration of the COVID-19 declaration under Section 564(b)(1) of the Act, 21 U.S.C. section 360bbb-3(b)(1), unless the authorization is terminated or revoked.  Performed at [facility], [address] [place], [place] [address]   Gastrointestinal Panel by PCR , Stool     Status: None   Collection Time: 02/19/21 10:50 PM   Specimen: Stool  Result Value Ref Range Status   Campylobacter species NOT DETECTED NOT DETECTED Final   Plesimonas shigelloides NOT DETECTED NOT DETECTED Final   Salmonella species NOT DETECTED NOT DETECTED Final   Yersinia enterocolitica NOT DETECTED NOT DETECTED Final   Vibrio species NOT DETECTED NOT DETECTED Final   Vibrio cholerae NOT DETECTED NOT DETECTED Final   Enteroaggregative E coli (EAEC) NOT DETECTED NOT DETECTED Final   Enteropathogenic E coli (EPEC) NOT DETECTED NOT DETECTED Final   Enterotoxigenic E coli (ETEC) NOT DETECTED NOT DETECTED Final   Shiga like toxin  producing E coli (STEC) NOT DETECTED NOT DETECTED Final   Shigella/Enteroinvasive E coli (EIEC) NOT DETECTED NOT DETECTED Final   Cryptosporidium NOT DETECTED NOT DETECTED Final   Cyclospora cayetanensis NOT DETECTED NOT DETECTED Final   Entamoeba histolytica NOT DETECTED NOT DETECTED Final   Giardia lamblia NOT DETECTED NOT DETECTED Final   Adenovirus F40/41 NOT DETECTED NOT DETECTED Final   Astrovirus NOT DETECTED NOT DETECTED Final   Norovirus GI/GII NOT DETECTED NOT DETECTED Final   Rotavirus A NOT DETECTED NOT DETECTED Final   Sapovirus (I, II, IV, and V) NOT DETECTED NOT DETECTED Final    Comment: Performed at [facility], [address] [place], [place] [address]  C Difficile Quick Screen w PCR reflex     Status: None   Collection Time: 02/19/21 10:50 PM   Specimen: Stool  Result Value Ref Range Status   C Diff antigen NEGATIVE NEGATIVE Final   C Diff toxin NEGATIVE NEGATIVE Final   C Diff interpretation No C. difficile detected.  Final    Comment: Performed at [facility], [address], [place], [place] [address]             Meredeth Ide   [facility] If 7PM-7AM, please contact night-coverage at [email or web address], Office  [phone]   02/22/2021, 4:37 PM  LOS: 2 days

## 2021-02-22 NOTE — Progress Notes (Signed)
[facility] Gastroenterology Inpatient Progress Note  Subjective: Called to see patient by Dr. Sharl Ma for persistent diarrhea and hematochezia. Negative stool studies. Patient with incr WBC to 28k, now LLQ pain. Seen by surgery who advised close monitoring but deemed abdomen was non-surgical. CT scan of the abdomen and pelvis has been ordered and pending. Patient says she has pain, bleeding this am of a small amount. Patient says she is having trouble staying comfortable due to pain.   Objective: Vital signs in last 24 hours: Temp:  [98.4 F (36.9 C)-99.2 F (37.3 C)] 99.2 F (37.3 C) (04/04 1537) Pulse Rate:  [101-110] 101 (04/04 1537) Resp:  [16-17] 16 (04/04 1537) BP: (131-158)/(73-86) 142/73 (04/04 1537) SpO2:  [97 %-100 %] 100 % (04/04 1537) Blood pressure (!) 142/73, pulse (!) 101, temperature 99.2 F (37.3 C), temperature source Oral, resp. rate 16, height 5\' 6"  (1.676 m), weight 73.9 kg, SpO2 100 %.    Intake/Output from previous day: 04/03 0701 - 04/04 0700 In: 600 [P.O.:600] Out: -   Intake/Output this shift: Total I/O In: 1560 [P.O.:1360; IV Piggyback:200] Out: -    General appearance: Somnolent, but arousable and oriented x 3.  Resp:  CTA   Cardio: RRR GI:  Soft, moderately tender in the LLQ with mild guarding, no rebound. BS+ Extremities:  NO edema.   Lab Results: Results for orders placed or performed during the hospital encounter of 02/19/21 (from the past 24 hour(s))  CBC     Status: Abnormal   Collection Time: 02/22/21  4:48 AM  Result Value Ref Range   WBC 27.9 (H) 4.0 - 10.5 K/uL   RBC 4.21 3.87 - 5.11 MIL/uL   Hemoglobin 10.6 (L) 12.0 - 15.0 g/dL   HCT 04/24/21 (L) 29.9 - 37.1 %   MCV 81.2 80.0 - 100.0 fL   MCH 25.2 (L) 26.0 - 34.0 pg   MCHC 31.0 30.0 - 36.0 g/dL   RDW 69.6 78.9 - 38.1 %   Platelets 269 150 - 400 K/uL   nRBC 0.0 0.0 - 0.2 %  Comprehensive metabolic panel     Status: Abnormal   Collection Time: 02/22/21  4:48 AM  Result Value Ref  Range   Sodium 139 135 - 145 mmol/L   Potassium 3.9 3.5 - 5.1 mmol/L   Chloride 107 98 - 111 mmol/L   CO2 25 22 - 32 mmol/L   Glucose, Bld 111 (H) 70 - 99 mg/dL   BUN 9 8 - 23 mg/dL   Creatinine, Ser 04/24/21 0.44 - 1.00 mg/dL   Calcium 8.4 (L) 8.9 - 10.3 mg/dL   Total Protein 6.2 (L) 6.5 - 8.1 g/dL   Albumin 3.2 (L) 3.5 - 5.0 g/dL   AST 15 15 - 41 U/L   ALT 12 0 - 44 U/L   Alkaline Phosphatase 68 38 - 126 U/L   Total Bilirubin 0.7 0.3 - 1.2 mg/dL   GFR, Estimated 5.10 >25 mL/min   Anion gap 7 5 - 15     Recent Labs    02/20/21 0414 02/21/21 0520 02/22/21 0448  WBC 27.9* 29.8* 27.9*  HGB 11.2* 11.8* 10.6*  HCT 37.4 38.6 34.2*  PLT 408* 334 269   BMET Recent Labs    02/20/21 0414 02/21/21 0520 02/22/21 0448  NA 139 140 139  K 4.1 4.0 3.9  CL 106 106 107  CO2 22 24 25   GLUCOSE 156* 126* 111*  BUN 23 12 9   CREATININE 1.34*  1.33* 0.88 0.78  CALCIUM 8.4* 8.7* 8.4*   LFT Recent Labs    02/22/21 0448  PROT 6.2*  ALBUMIN 3.2*  AST 15  ALT 12  ALKPHOS 68  BILITOT 0.7   PT/INR No results for input(s): LABPROT, INR in the last 72 hours. Hepatitis Panel No results for input(s): HEPBSAG, HCVAB, HEPAIGM, HEPBIGM in the last 72 hours. C-Diff Recent Labs    02/19/21 2250  CDIFFTOX NEGATIVE   No results for input(s): CDIFFPCR in the last 72 hours.   Studies/Results: No results found.  Scheduled Inpatient Medications:   . diphenhydrAMINE  50 mg Oral Once   Or  . diphenhydrAMINE  50 mg Intravenous Once  . enoxaparin (LOVENOX) injection  40 mg Subcutaneous Q24H  . pantoprazole (PROTONIX) IV  40 mg Intravenous Q24H  . sertraline  150 mg Oral Daily    Continuous Inpatient Infusions:   . ciprofloxacin 400 mg (02/22/21 1038)  . lactated ringers 100 mL/hr at 02/22/21 0458  . metronidazole 500 mg (02/22/21 1218)  . promethazine (PHENERGAN) injection      PRN Inpatient Medications:  acetaminophen **OR** acetaminophen, alum & mag hydroxide-simeth,  amitriptyline, morphine injection, ondansetron **OR** ondansetron (ZOFRAN) IV, promethazine (PHENERGAN) injection  Assessment:  1. Acute colitis - CT scan of abd/pelvis has been ordered and is pending given change in clinical status. 2. Rectal bleeding - Small amounts suggest anal outlet source, but cannot rule out ischemia, neoplasm. 3. Abdominal pain - Surgical opinion noted and appreciated.  Plan:  1. Await CT results. 2. Supportive care. 3. If CT nonspecific and without acute process, consider flexible sigmoidoscopy to investigate bleeding and diarrhea. 4. Will follow along.   Kea Callan K. Norma Fredrickson, M.D. 02/22/2021, 5:18 PM

## 2021-02-22 NOTE — Consult Note (Signed)
Reason for Consult: abdominal pain, diarrhea, hematochezia  Referring Physician: Mauro Kaufmann, MD (hospital medicine)  Bridget Woodard is an 70 y.o. female.  HPI: She was admitted to the hospital on February 20, 2021 with a 1 day history of nausea, vomiting, diarrhea, and a syncopal episode.  She had associated mild colicky abdominal pain.  Denied fevers or chills.  Reported no sick contacts or concern for food poisoning.  Evaluation in the emergency department showed leukocytosis of 12.3 as well as some electrolyte abnormalities and acute kidney injury.  A noncontrast CT scan of the abdomen and pelvis was consistent with a diarrheal illness and probable enteritis.  There was no acute bowel wall thickening or obstruction appreciated.  Appendix was normal.  She was admitted to the hospital medicine service for rehydration and antibiotics.  Stool studies and C. difficile were obtained and negative.  Urinalysis showed a large leukocyte esterase, but negative nitrates.  The culture showed multiple species, suggestive of contamination.  Apparently, she also had some bright red blood per rectum.  Gastroenterology was consulted.  They felt the bleeding was likely secondary to either hemorrhoidal disease or the patient's known diverticulosis.  No intervention was recommended by them at the time.  Her hemoglobin has remained stable since the time of admission.  Within 8 hours of admission, her white blood cell count jumped from 12.3 to 27.9.  Per review of the hospitalist note from yesterday, the leukocytosis was thought to be reactive.  Today, the patient reported an increase in her abdominal discomfort.  She has still not had any fevers or chills.  Since admission, she has not had any nausea or vomiting, although she continues to have diarrhea.  She reports a small amount of blood per rectum with her bowel movements.  Due to the persistent leukocytosis despite antibiotics and the increase in abdominal discomfort, general  surgery has been consulted by Dr. Sharl Ma for further evaluation.  Aside from mild persistent tachycardia, she has been hemodynamically stable and tolerating a diet.  Past Medical History:  Diagnosis Date  . Degenerative joint disease   . Hypertension   . Microcytic anemia   . Mitral valve prolapse   . RA (rheumatoid arthritis) (HCC)     Past Surgical History:  Procedure Laterality Date  . ABDOMINAL HYSTERECTOMY    . CHOLECYSTECTOMY    . ELBOW SURGERY    . FOOT SURGERY    . KNEE SURGERY    . SHOULDER SURGERY    . TOTAL HIP ARTHROPLASTY      History reviewed. No pertinent family history.  Social History:  reports that she has never smoked. She does not have any smokeless tobacco history on file. She reports that she does not drink alcohol and does not use drugs.  Allergies:  Allergies  Allergen Reactions  . Aspirin   . Darvon [Propoxyphene]   . Iodine   . Percocet [Oxycodone-Acetaminophen]   . Shellfish Allergy   . Sulfa Antibiotics     Medications: I have reviewed the patient's current medications.  Results for orders placed or performed during the hospital encounter of 02/19/21 (from the past 48 hour(s))  CBC     Status: Abnormal   Collection Time: 02/21/21  5:20 AM  Result Value Ref Range   WBC 29.8 (H) 4.0 - 10.5 K/uL   RBC 4.65 3.87 - 5.11 MIL/uL   Hemoglobin 11.8 (L) 12.0 - 15.0 g/dL   HCT 40.9 81.1 - 91.4 %   MCV 83.0 80.0 - 100.0  fL   MCH 25.4 (L) 26.0 - 34.0 pg   MCHC 30.6 30.0 - 36.0 g/dL   RDW 16.1 (H) 09.6 - 04.5 %   Platelets 334 150 - 400 K/uL   nRBC 0.0 0.0 - 0.2 %    Comment: Performed at [facility], [address] [place], [place] [address]  Basic metabolic panel     Status: Abnormal   Collection Time: 02/21/21  5:20 AM  Result Value Ref Range   Sodium 140 135 - 145 mmol/L   Potassium 4.0 3.5 - 5.1 mmol/L   Chloride 106 98 - 111 mmol/L   CO2 24 22 - 32 mmol/L   Glucose, Bld 126 (H) 70 - 99 mg/dL    Comment: Glucose reference  range applies only to samples taken after fasting for at least 8 hours.   BUN 12 8 - 23 mg/dL   Creatinine, Ser 1.91 0.44 - 1.00 mg/dL   Calcium 8.7 (L) 8.9 - 10.3 mg/dL   GFR, Estimated >47 >82 mL/min    Comment: (NOTE) Calculated using the CKD-EPI Creatinine Equation (2021)    Anion gap 10 5 - 15    Comment: Performed at [facility], [address] [place], [place] [address]  CBC     Status: Abnormal   Collection Time: 02/22/21  4:48 AM  Result Value Ref Range   WBC 27.9 (H) 4.0 - 10.5 K/uL   RBC 4.21 3.87 - 5.11 MIL/uL   Hemoglobin 10.6 (L) 12.0 - 15.0 g/dL   HCT 30.8 (L) 65.7 - 84.6 %   MCV 81.2 80.0 - 100.0 fL   MCH 25.2 (L) 26.0 - 34.0 pg   MCHC 31.0 30.0 - 36.0 g/dL   RDW 96.2 95.2 - 84.1 %   Platelets 269 150 - 400 K/uL   nRBC 0.0 0.0 - 0.2 %    Comment: Performed at [facility], [address] [place], [place] [address]  Comprehensive metabolic panel     Status: Abnormal   Collection Time: 02/22/21  4:48 AM  Result Value Ref Range   Sodium 139 135 - 145 mmol/L   Potassium 3.9 3.5 - 5.1 mmol/L   Chloride 107 98 - 111 mmol/L   CO2 25 22 - 32 mmol/L   Glucose, Bld 111 (H) 70 - 99 mg/dL    Comment: Glucose reference range applies only to samples taken after fasting for at least 8 hours.   BUN 9 8 - 23 mg/dL   Creatinine, Ser 1.02 0.44 - 1.00 mg/dL   Calcium 8.4 (L) 8.9 - 10.3 mg/dL   Total Protein 6.2 (L) 6.5 - 8.1 g/dL   Albumin 3.2 (L) 3.5 - 5.0 g/dL   AST 15 15 - 41 U/L   ALT 12 0 - 44 U/L   Alkaline Phosphatase 68 38 - 126 U/L   Total Bilirubin 0.7 0.3 - 1.2 mg/dL   GFR, Estimated >72 >53 mL/min    Comment: (NOTE) Calculated using the CKD-EPI Creatinine Equation (2021)    Anion gap 7 5 - 15    Comment: Performed at [facility], [address], [place], [place] [address]   I personally reviewed the CT scan of the abdomen and pelvis that was performed at the time of her admission.  I concur with the radiology interpretation  copied here: CLINICAL DATA:  Vomiting and diarrhea  EXAM: CT ABDOMEN AND PELVIS WITHOUT CONTRAST  TECHNIQUE: Multidetector CT imaging of the abdomen and pelvis was performed  following the standard protocol without IV contrast.  COMPARISON:  None.  FINDINGS: Lower chest: Lung bases demonstrate lower lobe bronchiectasis and pulmonary fibrosis. Mild fibrosis in the right middle lobe and lingula. No acute airspace disease.  Hepatobiliary: Status post cholecystectomy. Calcified granuloma in the liver. Intra and extrahepatic biliary ductal dilatation, common bile duct diameter up to 12 mm.  Pancreas: Unremarkable. No pancreatic ductal dilatation or surrounding inflammatory changes.  Spleen: Normal in size without focal abnormality.  Adrenals/Urinary Tract: Adrenal glands are normal. Kidneys show no hydronephrosis. The urinary bladder is partially obscured by artifact  Stomach/Bowel: Stomach nonenlarged. Fluid-filled nondistended pelvic small bowel loops. Diffuse fluid within the colon. No acute bowel wall thickening. Negative appendix.  Vascular/Lymphatic: Nonaneurysmal aorta.  No suspicious nodes  Reproductive: Status post hysterectomy. No adnexal masses.  Other: No free air or free fluid  Musculoskeletal: Fatty atrophy of left iliopsoas muscle. Left hip replacement with artifact. No acute or suspicious osseous abnormality.  IMPRESSION: 1. Fluid-filled small and large bowel, consistent with a diarrheal illness and probable enteritis. No acute bowel wall thickening. No obstruction. 2. Intra and extrahepatic biliary ductal dilatation post cholecystectomy. Recommend correlation with LFTs. 3. Pulmonary fibrosis at the lung bases.   Review of Systems  Gastrointestinal: Positive for abdominal pain, blood in stool and diarrhea.   Blood pressure (!) 143/79, pulse (!) 101, temperature 98.9 F (37.2 C), temperature source Oral, resp. rate 16, height   (1.676 m), weight 73.9 kg, SpO2 97 %. Physical Exam Constitutional:      General: She is not in acute distress.    Appearance: She is normal weight.  HENT:     Head: Normocephalic and atraumatic.     Mouth/Throat:     Mouth: Mucous membranes are moist.  Eyes:     General: No scleral icterus.       Right eye: No discharge.        Left eye: No discharge.     Conjunctiva/sclera: Conjunctivae normal.  Cardiovascular:     Rate and Rhythm: Regular rhythm. Tachycardia present.  Pulmonary:     Effort: Pulmonary effort is normal. No respiratory distress.  Abdominal:     Palpations: Abdomen is soft.     Tenderness: There is abdominal tenderness.     Comments: Diffusely tender throughout all quadrants.  Slightly worse in the left lower quadrant.  No rebound, guarding, or other peritoneal signs.  Genitourinary:    Comments: Deferred Musculoskeletal:        General: No deformity or signs of injury.  Skin:    General: Skin is warm and dry.  Neurological:     General: No focal deficit present.     Mental Status: She is alert.  Psychiatric:        Mood and Affect: Mood normal.        Behavior: Behavior normal.     Assessment/Plan: This is a 70 year old woman who was admitted to the hospital with dehydration, acute kidney injury, nausea, vomiting, and diarrhea.  Stool studies and C. difficile evaluation were negative.  Imaging was most consistent with gastroenteritis.  She has had some blood per rectum that could potentially be attributed to hemorrhoidal disease versus diverticulosis.  Due to an increase in her abdominal pain as well as persistent leukocytosis despite antibiotic therapy, the hospital medicine service has engaged general surgery for further evaluation.  Currently, a CT scan of the abdomen and pelvis with contrast is being planned.  Due to the patient's contrast allergy, however, she  requires a steroid and Benadryl prep prior to proceeding with the imaging.  Once those results are  available, we will review them and make further recommendations based upon those findings.  As of the time of this documentation, however, I do not appreciate any surgical indications on physical examination, imaging, or further review of her chart.  We will continue to follow  Duanne Guess 02/22/2021, 1:48 PM

## 2021-02-23 LAB — CBC
HCT: 26.7 % — ABNORMAL LOW (ref 36.0–46.0)
Hemoglobin: 8.5 g/dL — ABNORMAL LOW (ref 12.0–15.0)
MCH: 25.3 pg — ABNORMAL LOW (ref 26.0–34.0)
MCHC: 31.8 g/dL (ref 30.0–36.0)
MCV: 79.5 fL — ABNORMAL LOW (ref 80.0–100.0)
Platelets: 221 10*3/uL (ref 150–400)
RBC: 3.36 MIL/uL — ABNORMAL LOW (ref 3.87–5.11)
RDW: 15.2 % (ref 11.5–15.5)
WBC: 26.4 10*3/uL — ABNORMAL HIGH (ref 4.0–10.5)
nRBC: 0 % (ref 0.0–0.2)

## 2021-02-23 LAB — COMPREHENSIVE METABOLIC PANEL
ALT: 10 U/L (ref 0–44)
AST: 15 U/L (ref 15–41)
Albumin: 3.2 g/dL — ABNORMAL LOW (ref 3.5–5.0)
Alkaline Phosphatase: 77 U/L (ref 38–126)
Anion gap: 8 (ref 5–15)
BUN: 10 mg/dL (ref 8–23)
CO2: 25 mmol/L (ref 22–32)
Calcium: 8.4 mg/dL — ABNORMAL LOW (ref 8.9–10.3)
Chloride: 103 mmol/L (ref 98–111)
Creatinine, Ser: 0.81 mg/dL (ref 0.44–1.00)
GFR, Estimated: >60 mL/min (ref >60)
Glucose, Bld: 88 mg/dL (ref 70–99)
Potassium: 3.1 mmol/L — ABNORMAL LOW (ref 3.5–5.1)
Sodium: 136 mmol/L (ref 135–145)
Total Bilirubin: 0.7 mg/dL (ref 0.3–1.2)
Total Protein: 6.1 g/dL — ABNORMAL LOW (ref 6.5–8.1)

## 2021-02-23 MED ORDER — POTASSIUM CHLORIDE CRYS ER 20 MEQ PO TBCR
40.0000 meq | EXTENDED_RELEASE_TABLET | ORAL | Status: AC
  Administered 2021-02-23 (×2): 40 meq via ORAL
  Filled 2021-02-23 (×2): qty 2

## 2021-02-23 MED ORDER — POTASSIUM CHLORIDE 10 MEQ/100ML IV SOLN
10.0000 meq | INTRAVENOUS | Status: DC
  Filled 2021-02-23 (×3): qty 100

## 2021-02-23 NOTE — Progress Notes (Signed)
[facility] Inpatient Progress Note  Subjective: Patient seen for f/u diarrhea, hematochezia. Persistent "colitis" on CT performed yesterday. Bleeding resolved but diarrhea remains. Abdominal pain much improved.  Objective: Vital signs in last 24 hours: Temp:  [98.5 F (36.9 C)-99.6 F (37.6 C)] 99.2 F (37.3 C) (04/05 1541) Pulse Rate:  [98-103] 103 (04/05 1541) Resp:  [16-22] 20 (04/05 1541) BP: (124-149)/(64-90) 124/90 (04/05 1541) SpO2:  [99 %-100 %] 100 % (04/05 1541) Weight:  [80.3 kg] 80.3 kg (04/05 0500) Blood pressure 124/90, pulse (!) 103, temperature 99.2 F (37.3 C), temperature source Oral, resp. rate 20, height 5\' 6"  (1.676 m), weight 80.3 kg, SpO2 100 %.    Intake/Output from previous day: 04/04 0701 - 04/05 0700 In: 1860 [P.O.:1360; IV Piggyback:500] Out: -   Intake/Output this shift: Total I/O In: 600 [P.O.:600] Out: -    Gen: NAD. Appears comfortable.  HEENT: [place]/AT. PERRLA. Normal external ear exam.  Chest: CTA, no wheezes.  CV: RR nl S1, S2. No gallops.  Abd: soft, mildly tender in LLQ without guarding, nd. BS+  Ext: no edema. Pulses 2+  Neuro: Alert and oriented. Judgement appears normal. Nonfocal.    Lab Results: Results for orders placed or performed during the hospital encounter of 02/19/21 (from the past 24 hour(s))  CBC     Status: Abnormal   Collection Time: 02/23/21  5:11 AM  Result Value Ref Range   WBC 26.4 (H) 4.0 - 10.5 K/uL   RBC 3.36 (L) 3.87 - 5.11 MIL/uL   Hemoglobin 8.5 (L) 12.0 - 15.0 g/dL   HCT 04/25/21 (L) 67.6 - 19.5 %   MCV 79.5 (L) 80.0 - 100.0 fL   MCH 25.3 (L) 26.0 - 34.0 pg   MCHC 31.8 30.0 - 36.0 g/dL   RDW 09.3 26.7 - 12.4 %   Platelets 221 150 - 400 K/uL   nRBC 0.0 0.0 - 0.2 %  Comprehensive metabolic panel     Status: Abnormal   Collection Time: 02/23/21  5:11 AM  Result Value Ref Range   Sodium 136 135 - 145 mmol/L   Potassium 3.1 (L) 3.5 - 5.1 mmol/L   Chloride 103 98 - 111 mmol/L    CO2 25 22 - 32 mmol/L   Glucose, Bld 88 70 - 99 mg/dL   BUN 10 8 - 23 mg/dL   Creatinine, Ser 04/25/21 0.44 - 1.00 mg/dL   Calcium 8.4 (L) 8.9 - 10.3 mg/dL   Total Protein 6.1 (L) 6.5 - 8.1 g/dL   Albumin 3.2 (L) 3.5 - 5.0 g/dL   AST 15 15 - 41 U/L   ALT 10 0 - 44 U/L   Alkaline Phosphatase 77 38 - 126 U/L   Total Bilirubin 0.7 0.3 - 1.2 mg/dL   GFR, Estimated 9.98 >33 mL/min   Anion gap 8 5 - 15     Recent Labs    02/21/21 0520 02/22/21 0448 02/23/21 0511  WBC 29.8* 27.9* 26.4*  HGB 11.8* 10.6* 8.5*  HCT 38.6 34.2* 26.7*  PLT 334 269 221   BMET Recent Labs    02/21/21 0520 02/22/21 0448 02/23/21 0511  NA 140 139 136  K 4.0 3.9 3.1*  CL 106 107 103  CO2 24 25 25   GLUCOSE 126* 111* 88  BUN 12 9 10   CREATININE 0.88 0.78 0.81  CALCIUM 8.7* 8.4* 8.4*   LFT Recent Labs    02/23/21 0511  PROT 6.1*  ALBUMIN 3.2*  AST 15  ALT 10  ALKPHOS 77  BILITOT 0.7   PT/INR No results for input(s): LABPROT, INR in the last 72 hours. Hepatitis Panel No results for input(s): HEPBSAG, HCVAB, HEPAIGM, HEPBIGM in the last 72 hours. C-Diff No results for input(s): CDIFFTOX in the last 72 hours. No results for input(s): CDIFFPCR in the last 72 hours.   Studies/Results: CT ABDOMEN PELVIS W CONTRAST  Result Date: 02/22/2021 CLINICAL DATA:  Diarrhea, lower GI bleed.  Enteritis suspected. EXAM: CT ABDOMEN AND PELVIS WITH CONTRAST TECHNIQUE: Multidetector CT imaging of the abdomen and pelvis was performed using the standard protocol following bolus administration of intravenous contrast. CONTRAST:  OMNIPAQUE IOHEXOL 300 MG/ML SOLN. The patient was pre-medicated with emergent prep or prior contrast reaction. No immediate breakthrough reaction following the procedure. COMPARISON:  02/19/2021 FINDINGS: Lower chest: Bronchiectasis and scarring in the lung bases, stable. No effusions. Hepatobiliary: Biliary ductal dilatation with common bile duct measuring up to 13 mm, stable. Prior  cholecystectomy. No suspicious focal hepatic abnormality. Pancreas: No focal abnormality or ductal dilatation. Spleen: No focal abnormality.  Normal size. Adrenals/Urinary Tract: No adrenal abnormality. No focal renal abnormality. No stones or hydronephrosis. Urinary bladder is unremarkable. Stomach/Bowel: Normal appendix. There is wall thickening involving the distal transverse colon, splenic flexure, and much of the descending colon compatible with colitis. Mild gaseous distention of the colon proximal to the area of wall thickening with layering fluid. Small bowel and stomach decompressed. Sigmoid diverticulosis. Vascular/Lymphatic: Scattered aortic calcifications. Mesenteric vessels appear patent without significant disease. No evidence of aneurysm or adenopathy. Reproductive: Prior hysterectomy.  No adnexal masses. Other: Small to moderate free fluid in the pelvis.  No free air. Musculoskeletal: No acute bony abnormality. IMPRESSION: Wall thickening involving the colon from the distal transverse colon through the descending colon compatible with colitis. Differential considerations would include infectious, inflammatory, or ischemic causes. No visible significant vascular disease. Prior cholecystectomy with intrahepatic and extrahepatic biliary duct dilatation, likely related to post cholecystectomy state. Small to moderate free fluid in the pelvis. Sigmoid diverticulosis. Electronically Signed   By: Charlett Nose M.D.   On: 02/22/2021 18:48    Scheduled Inpatient Medications:   . diphenhydrAMINE  50 mg Oral Once   Or  . diphenhydrAMINE  50 mg Intravenous Once  . enoxaparin (LOVENOX) injection  40 mg Subcutaneous Q24H  . pantoprazole (PROTONIX) IV  40 mg Intravenous Q24H  . potassium chloride  40 mEq Oral Q4H  . sertraline  150 mg Oral Daily    Continuous Inpatient Infusions:   . ciprofloxacin 400 mg (02/23/21 0859)  . lactated ringers 100 mL/hr at 02/22/21 2205  . metronidazole 500 mg  (02/23/21 1749)  . promethazine (PHENERGAN) injection      PRN Inpatient Medications:  acetaminophen **OR** acetaminophen, alum & mag hydroxide-simeth, amitriptyline, morphine injection, ondansetron **OR** ondansetron (ZOFRAN) IV, promethazine (PHENERGAN) injection  Miscellaneous:   Assessment:  1 Colitis. Improving with hydration, antibiotics.  2. Anemia secondary to lower GI blood loss - Some concern for ischemia vs. Inflammation. 3. Lower GI bleeding, as above.  Plan:  1. I have recommended flexible sigmoidoscopy. The patient understands the nature of the planned procedure, indications, risks, alternatives and potential complications including but not limited to bleeding, infection, perforation, damage to internal organs and possible oversedation/side effects from anesthesia. The patient agrees and gives consent to proceed.  Please refer to procedure notes for findings, recommendations and patient disposition/instructions.   Jocilyn Trego K. Norma Fredrickson, M.D. 02/23/2021, 6:53 PM

## 2021-02-23 NOTE — Progress Notes (Signed)
Mobility Specialist - Progress Note   02/23/21 1147  Mobility  Activity Ambulated in hall  Level of Assistance Modified independent, requires aide device or extra time  Assistive Device Front wheel walker  Distance Ambulated (ft) 200 ft  Mobility Response Tolerated well  Mobility performed by Mobility specialist  $Mobility charge 1 Mobility    Pre-mobility: 100 HR, 97% SpO2 During mobility: 116 maxHR, 98% SpO2 Post-mobility: 106 HR, 95% SpO2   Pt ambulated in hallway with RW. Gets around mostly with SPC, but does have RW at home. Denied SOB. O2 maintained high 90s on RA. C/o fatigue. RPE 3/10.    Filiberto Pinks Mobility Specialist 02/23/21, 11:51 AM

## 2021-02-23 NOTE — Care Management Important Message (Signed)
Important Message  Patient Details  Name: Nazaret Chea MRN: [id number] Date of Birth: [birth year]   Medicare Important Message Given:  N/A - LOS <3 / Initial given by admissions  Initial Medicare IM given by Cyril Mourning, Patient Access Associate, on 02/22/2021 at 9:13am.   Johnell Comings 02/23/2021, 8:22 AM

## 2021-02-23 NOTE — Progress Notes (Signed)
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  CT abdomen/pelvis showed diarrheal illness and probable enteritis.  GI stool pathogen panel was negative.  C. difficile PCR was negative.  Patient also had bright red blood per rectum.    Subjective   Patient seen and examined, feels better this morning.  CT scan of the abdomen/pelvis with contrast done yesterday showed colitis involving portion of transverse colon and descending colon.  Differential including infectious, inflammatory or ischemic.   Assessment/Plan:     1. Diarrhea/enteritis/colitis-patient presented with diarrhea, with bloody stools.  Initial CT scan of the abdomen without contrast only showed enteritis.  GI was consulted however it was felt that patient diarrhea is from enteritis and may have bleeding from diverticulosis.  Patient continues to do to have abdominal pain with bloody diarrhea, she was empirically started on Cipro and Flagyl.  Stool for C. difficile PCR as well as GI pathogen panel was negative.  Patient had persistent leukocytosis so repeat CT scan of the abdomen/pelvis was done after patient was given Solu-Medrol for contrast allergy.  CT scan of the abdomen/pelvis shows colitis involving part of transverse colon and most of descending colon.  Etiologies including infectious versus inflammatory versus ischemic.  GI has been following, will follow their recommendations.  Patient is tolerating clear liquid diet.     2. Acute kidney injury-resolved, likely from ongoing diarrhea as above.  Creatinine has improved to 0.88 after IV fluids.  Follow BMP in am.  3. Rectal bleeding-patient having  intermittent episodes of rectal bleeding.  Gastroenterology was consulted, they felt that it is either diverticular or rectal bleeding.  Hemoglobin has remained stable.  No further interventi recommended at this time.  We will continue to monitor patient's hemoglobin.  4. Syncope-patient had syncopal episode before she came to hospital.  Likely from dehydration from above.  Continue monitoring on telemetry.  5. Leukocytosis-unclear etiology, patient empirically started on Cipro and Flagyl for colitis as above.  Urine culture grew multiple species.  Will follow CBC in a.m.  6. Hypertension-lisinopril is on hold due to acute kidney injury.  7. Hypokalemia-potassium is 3.1, will replace potassium and follow BMP in am.   Scheduled medications:   . diphenhydrAMINE  50 mg Oral Once   Or  . diphenhydrAMINE  50 mg Intravenous Once  . enoxaparin (LOVENOX) injection  40 mg Subcutaneous Q24H  . pantoprazole (PROTONIX) IV  40 mg Intravenous Q24H  . sertraline  150 mg Oral Daily         Data Reviewed:   CBG:  No results for input(s): GLUCAP in the last 168 hours.  SpO2: 100 %    Vitals:   02/23/21 0357 02/23/21 0500 02/23/21 0807 02/23/21 1142  BP: (!) 146/78  (!) 147/69 (!) 149/77  Pulse: (!) 103  98 99  Resp: 16  (!) 22 20  Temp: 99.1 F (37.3 C)  98.7 F (37.1 C) 99.6 F (37.6 C)  TempSrc: Oral  Oral Oral  SpO2: 99%  100% 100%  Weight:  80.3 kg    Height:         Intake/Output Summary (Last 24 hours) at 02/23/2021 1239 Last data filed at 02/23/2021 0102 Gross  per 24 hour  Intake 1860 ml  Output --  Net 1860 ml    04/03 1901 - 04/05 0700 In: 1860 [P.O.:1360] Out: -   Filed Weights   02/20/21 0947 02/21/21 0420 02/23/21 0500  Weight: 75.6 kg 73.9 kg 80.3 kg    CBC:  Recent Labs  Lab 02/19/21 2028 02/20/21 0414 02/21/21 0520 02/22/21 0448 02/23/21 0511  WBC 12.3* 27.9* 29.8* 27.9* 26.4*  HGB 11.9* 11.2* 11.8* 10.6* 8.5*  HCT 39.4 37.4 38.6 34.2* 26.7*   PLT 506* 408* 334 269 221  MCV 81.7 83.7 83.0 81.2 79.5*  MCH 24.7* 25.1* 25.4* 25.2* 25.3*  MCHC 30.2 29.9* 30.6 31.0 31.8  RDW 15.4 15.5 15.7* 15.4 15.2  LYMPHSABS 7.1*  --   --   --   --   MONOABS 0.4  --   --   --   --   EOSABS 0.4  --   --   --   --   BASOSABS 0.1  --   --   --   --     Complete metabolic panel:  Recent Labs  Lab 02/19/21 2028 02/20/21 0414 02/21/21 0520 02/22/21 0448 02/23/21 0511  NA 139 139 140 139 136  K 2.9* 4.1 4.0 3.9 3.1*  CL 102 106 106 107 103  CO2 GLUCOSE 174* 156* 126* 111* 88  BUN 25* CREATININE 1.69* 1.34*  1.33* 0.88 0.78 0.81  CALCIUM 9.2 8.4* 8.7* 8.4* 8.4*  AST 34  --   --  15 15  ALT 20  --   --  12 10  ALKPHOS 111  --   --  68 77  BILITOT 0.6  --   --  0.7 0.7  ALBUMIN 4.1  --   --  3.2* 3.2*  MG 1.7 1.8  --   --   --     Recent Labs  Lab 02/19/21 2028  LIPASE 33    Recent Labs  Lab 02/19/21 2107  SARSCOV2NAA NEGATIVE    ------------------------------------------------------------------------------------------------------------------ No results for input(s): CHOL, HDL, LDLCALC, TRIG, CHOLHDL, LDLDIRECT in the last 72 hours.  No results found for: HGBA1C ------------------------------------------------------------------------------------------------------------------ No results for input(s): TSH, T4TOTAL, T3FREE, THYROIDAB in the last 72 hours.  Invalid input(s): FREET3 ------------------------------------------------------------------------------------------------------------------ No results for input(s): VITAMINB12, FOLATE, FERRITIN, TIBC, IRON, RETICCTPCT in the last 72 hours.  Coagulation profile  No results for input(s): INR, PROTIME in the last 168 hours.  No results for input(s): DDIMER in the last 72 hours.  Cardiac Enzymes  No results for input(s): CKMB, TROPONINI, MYOGLOBIN in the last 168 hours.  Invalid input(s):  CK ------------------------------------------------------------------------------------------------------------------ No results found for: BNP   Antibiotics: Anti-infectives (From admission, onward)   Start     Dose/Rate Route Frequency Ordered Stop   02/22/21 1030  ciprofloxacin (CIPRO) IVPB 400 mg        400 mg 200 mL/hr over 60 Minutes Intravenous Every 12 hours 02/22/21 0939     02/22/21 1030  metroNIDAZOLE (FLAGYL) IVPB 500 mg        500 mg 100 mL/hr over 60 Minutes Intravenous Every 8 hours 02/22/21 0939     02/21/21 0800  ciprofloxacin (CIPRO) tablet 500 mg  Status:  Discontinued        500 mg Oral 2 times daily 02/21/21 0126 02/22/21 0939   02/21/21 0600  metroNIDAZOLE (FLAGYL) tablet 500 mg  Status:  Discontinued  500 mg Oral Every 8 hours 02/21/21 0126 02/22/21 0939   02/20/21 2100  ciprofloxacin (CIPRO) IVPB 400 mg  Status:  Discontinued        400 mg 200 mL/hr over 60 Minutes Intravenous Every 12 hours 02/20/21 1858 02/21/21 0126   02/20/21 2000  metroNIDAZOLE (FLAGYL) IVPB 500 mg  Status:  Discontinued        500 mg 100 mL/hr over 60 Minutes Intravenous Every 8 hours 02/20/21 1858 02/21/21 0126       Radiology Reports  CT ABDOMEN PELVIS W CONTRAST  Result Date: 02/22/2021 CLINICAL DATA:  Diarrhea, lower GI bleed.  Enteritis suspected. EXAM: CT ABDOMEN AND PELVIS WITH CONTRAST TECHNIQUE: Multidetector CT imaging of the abdomen and pelvis was performed using the standard protocol following bolus administration of intravenous contrast. CONTRAST:  OMNIPAQUE IOHEXOL 300 MG/ML SOLN. The patient was pre-medicated with emergent prep or prior contrast reaction. No immediate breakthrough reaction following the procedure. COMPARISON:  02/19/2021 FINDINGS: Lower chest: Bronchiectasis and scarring in the lung bases, stable. No effusions. Hepatobiliary: Biliary ductal dilatation with common bile duct measuring up to 13 mm, stable. Prior cholecystectomy. No suspicious focal  hepatic abnormality. Pancreas: No focal abnormality or ductal dilatation. Spleen: No focal abnormality.  Normal size. Adrenals/Urinary Tract: No adrenal abnormality. No focal renal abnormality. No stones or hydronephrosis. Urinary bladder is unremarkable. Stomach/Bowel: Normal appendix. There is wall thickening involving the distal transverse colon, splenic flexure, and much of the descending colon compatible with colitis. Mild gaseous distention of the colon proximal to the area of wall thickening with layering fluid. Small bowel and stomach decompressed. Sigmoid diverticulosis. Vascular/Lymphatic: Scattered aortic calcifications. Mesenteric vessels appear patent without significant disease. No evidence of aneurysm or adenopathy. Reproductive: Prior hysterectomy.  No adnexal masses. Other: Small to moderate free fluid in the pelvis.  No free air. Musculoskeletal: No acute bony abnormality. IMPRESSION: Wall thickening involving the colon from the distal transverse colon through the descending colon compatible with colitis. Differential considerations would include infectious, inflammatory, or ischemic causes. No visible significant vascular disease. Prior cholecystectomy with intrahepatic and extrahepatic biliary duct dilatation, likely related to post cholecystectomy state. Small to moderate free fluid in the pelvis. Sigmoid diverticulosis. Electronically Signed   By: Charlett Nose M.D.   On: 02/22/2021 18:48      DVT prophylaxis: Lovenox  Code Status: Full code  Family Communication: Discussed with patient husband on phone   Consultants:  Gastroenterology  Procedures:      Objective    Physical Examination:    General-appears in no acute distress  Heart-S1-S2, regular, no murmur auscultated  Lungs-clear to auscultation bilaterally, no wheezing or crackles auscultated  Abdomen-soft, nontender, no organomegaly  Extremities-no edema in the lower extremities  Neuro-alert, oriented  x3, no focal deficit noted   Status is: Inpatient  Dispo: The patient is from: Home              Anticipated d/c is to: Home              Anticipated d/c date is: 02/25/2021              Patient currently not medically stable for discharge  Barrier to discharge-ongoing evaluation for rectal bleeding, diarrhea, enteritis  COVID-19 Labs  No results for input(s): DDIMER, FERRITIN, LDH, CRP in the last 72 hours.  Lab Results  Component Value Date   SARSCOV2NAA NEGATIVE 02/19/2021    Microbiology  Recent Results (from the past 240 hour(s))  Urine culture  Status: Abnormal   Collection Time: 02/19/21  8:28 PM   Specimen: Urine, Random  Result Value Ref Range Status   Specimen Description   Final    URINE, RANDOM Performed at [facility], [address] [place], [place] [address]    Special Requests   Final    NONE Performed at [facility], [address] [place], [place] [address]    Culture MULTIPLE SPECIES PRESENT, SUGGEST RECOLLECTION (A)  Final   Report Status 02/21/2021 FINAL  Final  Resp Panel by RT-PCR (Flu A&B, Covid) Urine, Clean Catch     Status: None   Collection Time: 02/19/21  9:07 PM   Specimen: Urine, Clean Catch; Nasopharyngeal(NP) swabs in vial transport medium  Result Value Ref Range Status   SARS Coronavirus 2 by RT PCR NEGATIVE NEGATIVE Final    Comment: (NOTE) SARS-CoV-2 target nucleic acids are NOT DETECTED.  The SARS-CoV-2 RNA is generally detectable in upper respiratory specimens during the acute phase of infection. The lowest concentration of SARS-CoV-2 viral copies this assay can detect is 138 copies/mL. A negative result does not preclude SARS-Cov-2 infection and should not be used as the sole basis for treatment or other patient management decisions. A negative result may occur with  improper specimen collection/handling, submission of specimen other than nasopharyngeal swab, presence of viral mutation(s) within  the areas targeted by this assay, and inadequate number of viral copies(<138 copies/mL). A negative result must be combined with clinical observations, patient history, and epidemiological information. The expected result is Negative.  Fact Sheet for Patients:  BloggerCourse.com  Fact Sheet for Healthcare Providers:  SeriousBroker.it  This test is no t yet approved or cleared by the [place] FDA and  has been authorized for detection and/or diagnosis of SARS-CoV-2 by FDA under an Emergency Use Authorization (EUA). This EUA will remain  in effect (meaning this test can be used) for the duration of the COVID-19 declaration under Section 564(b)(1) of the Act, 21 U.S.C.section 360bbb-3(b)(1), unless the authorization is terminated  or revoked sooner.       Influenza A by PCR NEGATIVE NEGATIVE Final   Influenza B by PCR NEGATIVE NEGATIVE Final    Comment: (NOTE) The Xpert Xpress SARS-CoV-2/FLU/RSV plus assay is intended as an aid in the diagnosis of influenza from Nasopharyngeal swab specimens and should not be used as a sole basis for treatment. Nasal washings and aspirates are unacceptable for Xpert Xpress SARS-CoV-2/FLU/RSV testing.  Fact Sheet for Patients: BloggerCourse.com  Fact Sheet for Healthcare Providers: SeriousBroker.it  This test is not yet approved or cleared by the [place] FDA and has been authorized for detection and/or diagnosis of SARS-CoV-2 by FDA under an Emergency Use Authorization (EUA). This EUA will remain in effect (meaning this test can be used) for the duration of the COVID-19 declaration under Section 564(b)(1) of the Act, 21 U.S.C. section 360bbb-3(b)(1), unless the authorization is terminated or revoked.  Performed at [facility], [address] [place], [place] [address]   Gastrointestinal Panel by PCR , Stool     Status:  None   Collection Time: 02/19/21 10:50 PM   Specimen: Stool  Result Value Ref Range Status   Campylobacter species NOT DETECTED NOT DETECTED Final   Plesimonas shigelloides NOT DETECTED NOT DETECTED Final   Salmonella species NOT DETECTED NOT DETECTED Final   Yersinia enterocolitica NOT DETECTED NOT DETECTED Final   Vibrio species NOT DETECTED NOT DETECTED Final   Vibrio cholerae NOT DETECTED NOT DETECTED  Final   Enteroaggregative E coli (EAEC) NOT DETECTED NOT DETECTED Final   Enteropathogenic E coli (EPEC) NOT DETECTED NOT DETECTED Final   Enterotoxigenic E coli (ETEC) NOT DETECTED NOT DETECTED Final   Shiga like toxin producing E coli (STEC) NOT DETECTED NOT DETECTED Final   Shigella/Enteroinvasive E coli (EIEC) NOT DETECTED NOT DETECTED Final   Cryptosporidium NOT DETECTED NOT DETECTED Final   Cyclospora cayetanensis NOT DETECTED NOT DETECTED Final   Entamoeba histolytica NOT DETECTED NOT DETECTED Final   Giardia lamblia NOT DETECTED NOT DETECTED Final   Adenovirus F40/41 NOT DETECTED NOT DETECTED Final   Astrovirus NOT DETECTED NOT DETECTED Final   Norovirus GI/GII NOT DETECTED NOT DETECTED Final   Rotavirus A NOT DETECTED NOT DETECTED Final   Sapovirus (I, II, IV, and V) NOT DETECTED NOT DETECTED Final    Comment: Performed at [facility], [address] [place], [place] [address]  C Difficile Quick Screen w PCR reflex     Status: None   Collection Time: 02/19/21 10:50 PM   Specimen: Stool  Result Value Ref Range Status   C Diff antigen NEGATIVE NEGATIVE Final   C Diff toxin NEGATIVE NEGATIVE Final   C Diff interpretation No C. difficile detected.  Final    Comment: Performed at [facility], [address], [place], [place] [address]             Meredeth Ide   [facility] If 7PM-7AM, please contact night-coverage at [email or web address], Office  [phone]   02/23/2021, 12:39 PM  LOS: 3 days

## 2021-02-24 ENCOUNTER — Inpatient Hospital Stay: Payer: Medicare Other | Admitting: Anesthesiology

## 2021-02-24 ENCOUNTER — Encounter: Payer: Self-pay | Admitting: Internal Medicine

## 2021-02-24 ENCOUNTER — Encounter
Admission: EM | Disposition: A | Discharge status: Home / Self Care | Payer: Self-pay | Source: Home / Self Care | Attending: Family Medicine

## 2021-02-24 DIAGNOSIS — D509 Iron deficiency anemia, unspecified: Secondary | ICD-10-CM

## 2021-02-24 DIAGNOSIS — E876 Hypokalemia: Secondary | ICD-10-CM

## 2021-02-24 DIAGNOSIS — K559 Vascular disorder of intestine, unspecified: Principal | ICD-10-CM

## 2021-02-24 HISTORY — PX: FLEXIBLE SIGMOIDOSCOPY: SHX5431

## 2021-02-24 LAB — COMPREHENSIVE METABOLIC PANEL
ALT: 16 U/L (ref 0–44)
AST: 38 U/L (ref 15–41)
Albumin: 3.3 g/dL — ABNORMAL LOW (ref 3.5–5.0)
Alkaline Phosphatase: 76 U/L (ref 38–126)
Anion gap: 5 (ref 5–15)
BUN: 5 mg/dL — ABNORMAL LOW (ref 8–23)
CO2: 27 mmol/L (ref 22–32)
Calcium: 8.5 mg/dL — ABNORMAL LOW (ref 8.9–10.3)
Chloride: 106 mmol/L (ref 98–111)
Creatinine, Ser: 0.82 mg/dL (ref 0.44–1.00)
GFR, Estimated: >60 mL/min (ref >60)
Glucose, Bld: 100 mg/dL — ABNORMAL HIGH (ref 70–99)
Potassium: 3.1 mmol/L — ABNORMAL LOW (ref 3.5–5.1)
Sodium: 138 mmol/L (ref 135–145)
Total Bilirubin: 0.5 mg/dL (ref 0.3–1.2)
Total Protein: 6.4 g/dL — ABNORMAL LOW (ref 6.5–8.1)

## 2021-02-24 LAB — CBC
HCT: 27.6 % — ABNORMAL LOW (ref 36.0–46.0)
Hemoglobin: 8.7 g/dL — ABNORMAL LOW (ref 12.0–15.0)
MCH: 25.2 pg — ABNORMAL LOW (ref 26.0–34.0)
MCHC: 31.5 g/dL (ref 30.0–36.0)
MCV: 80 fL (ref 80.0–100.0)
Platelets: 214 10*3/uL (ref 150–400)
RBC: 3.45 MIL/uL — ABNORMAL LOW (ref 3.87–5.11)
RDW: 15.3 % (ref 11.5–15.5)
WBC: 15 10*3/uL — ABNORMAL HIGH (ref 4.0–10.5)
nRBC: 0 % (ref 0.0–0.2)

## 2021-02-24 SURGERY — SIGMOIDOSCOPY, FLEXIBLE
Anesthesia: General

## 2021-02-24 MED ORDER — PROPOFOL 10 MG/ML IV BOLUS
INTRAVENOUS | Status: DC | PRN
  Administered 2021-02-24: 40 mg via INTRAVENOUS

## 2021-02-24 MED ORDER — SODIUM CHLORIDE 0.9 % IV SOLN
INTRAVENOUS | Status: DC
  Administered 2021-02-24: 1000 mL via INTRAVENOUS

## 2021-02-24 MED ORDER — LIDOCAINE HCL (CARDIAC) PF 100 MG/5ML IV SOSY
PREFILLED_SYRINGE | INTRAVENOUS | Status: DC | PRN
  Administered 2021-02-24: 80 mg via INTRAVENOUS

## 2021-02-24 MED ORDER — PROPOFOL 500 MG/50ML IV EMUL
INTRAVENOUS | Status: DC | PRN
  Administered 2021-02-24: 125 ug/kg/min via INTRAVENOUS

## 2021-02-24 MED ORDER — POTASSIUM CHLORIDE CRYS ER 20 MEQ PO TBCR
40.0000 meq | EXTENDED_RELEASE_TABLET | Freq: Once | ORAL | Status: AC
  Administered 2021-02-24: 40 meq via ORAL

## 2021-02-24 MED ORDER — PROPOFOL 500 MG/50ML IV EMUL
INTRAVENOUS | Status: AC
  Filled 2021-02-24: qty 50

## 2021-02-24 NOTE — Progress Notes (Signed)
[facility] GI inpatient brief Progress Note  Flexible sigmoidoscopy completed. Findings consistent with moderately severe ischemic colitis with multiple ulcerations. Biopsies taken.   Vitals:   02/24/21 1307 02/24/21 1308  BP: 137/73 137/73  Pulse: 87 87  Resp: (!) 24 20  Temp:    SpO2: 95% 95%       Labs: .LABLAST[MCH:3,MCV:*,HCT}   Impression:  1. Acute colitis, endoscopic findings compatible with ischemia. 2. Leukocytosis -?From steroids. Clinically stable and improving.    Plan:  1.Discontinue IV antibiotics. 2.Continue IV fluid support. 3.Advance to soft diet.   Will  follow peripherally. Will follow up on pathology   Thank you  T. Bing Plume, M.D. ABIM Diplomate in Gastroenterology [facility] [phone] - Cell

## 2021-02-24 NOTE — Op Note (Signed)
[facility] Gastroenterology Patient Name: Bridget Woodard Procedure Date: 02/24/2021 12:32 PM MRN: [id number] Account #: [id number] Date of Birth: 06-24-1951 Admit Type: Inpatient Age: 70 Room: [facility] ENDO ROOM 2 Gender: Female Note Status: Finalized Procedure:             Flexible Sigmoidoscopy Indications:           Hematochezia, Diarrhea (presumed secondary to ischemic                         colitis), Acute post hemorrhagic anemia Providers:             Boykin Nearing. Norma Fredrickson MD, MD Referring MD:          No Local Md, MD (Referring MD) Medicines:             Propofol per Anesthesia Complications:         No immediate complications. Estimated blood loss:                         Minimal. Procedure:             Pre-Anesthesia Assessment:                        - The risks and benefits of the procedure and the                         sedation options and risks were discussed with the                         patient. All questions were answered and informed                         consent was obtained.                        - Patient identification and proposed procedure were                         verified prior to the procedure by the nurse. The                         procedure was verified in the procedure room.                        - ASA Grade Assessment: III - A patient with severe                         systemic disease.                        - After reviewing the risks and benefits, the patient                         was deemed in satisfactory condition to undergo the                         procedure.                        After obtaining informed consent, the scope was  passed                         under direct vision. The Colonoscope was introduced                         through the anus and advanced to the the descending                         colon. The flexible sigmoidoscopy was somewhat                         difficult due to poor endoscopic  visualization.                         Successful completion of the procedure was aided by                         lavage. The quality of the bowel preparation was fair. Findings:      The perianal and digital rectal examinations were normal. Pertinent       negatives include normal sphincter tone and no palpable rectal lesions.      A continuous area of nonbleeding ulcerated mucosa with no stigmata of       recent bleeding was present in the proximal sigmoid colon and in the       descending colon. Biopsies were taken with a cold forceps for histology.      Normal mucosa was found in the distal sigmoid colon. Biopsies were taken       with a cold forceps for histology.      Normal mucosa was found in the rectum.      The retroflexed view of the distal rectum and anal verge was normal and       showed no anal or rectal abnormalities. Impression:            - Preparation of the colon was fair.                        - Mucosal ulceration. Biopsied.                        - Normal mucosa in the distal sigmoid colon. Biopsied.                        - Normal mucosa in the rectum. Recommendation:        - Return patient to hospital ward for observation.                        - Advance diet as tolerated. Procedure Code(s):     --- Professional ---                        [phone], Sigmoidoscopy, flexible; with biopsy, single or                         multiple Diagnosis Code(s):     --- Professional ---                        D62, Acute posthemorrhagic anemia  R19.7, Diarrhea, unspecified                        K92.1, Melena (includes Hematochezia)                        K63.3, Ulcer of intestine CPT copyright 2019 American Medical Association. All rights reserved. The codes documented in this report are preliminary and upon coder review may  be revised to meet current compliance requirements. Stanton Kidney MD, MD 02/24/2021 1:01:14 PM This report has been signed  electronically. Number of Addenda: 0 Note Initiated On: 02/24/2021 12:32 PM Total Procedure Duration: 0 hours 9 minutes 1 second  Estimated Blood Loss:  Estimated blood loss was minimal.      [facility]

## 2021-02-24 NOTE — Progress Notes (Signed)
Paged GI on call "Bridget Woodard has Flex sigmoid with banding today for diverticulosis. Family, including husband is very upset that no once called. Call [phone]"

## 2021-02-24 NOTE — Transfer of Care (Signed)
Immediate Anesthesia Transfer of Care Note  Patient: Bridget Woodard  Procedure(s) Performed: FLEXIBLE SIGMOIDOSCOPY (N/A )  Patient Location: PACU  Anesthesia Type:General  Level of Consciousness: awake and alert   Airway & Oxygen Therapy: Patient Spontanous Breathing and Patient connected to nasal cannula oxygen  Post-op Assessment: Report given to RN and Post -op Vital signs reviewed and stable  Post vital signs: stable  Last Vitals:  Vitals Value Taken Time  BP 137/73 02/24/21 1307  Temp    Pulse 87 02/24/21 1307  Resp 20 02/24/21 1307  SpO2 95 % 02/24/21 1307  Vitals shown include unvalidated device data.  Last Pain:  Vitals:   02/24/21 1137  TempSrc: Temporal  PainSc: 0-No pain      Patients Stated Pain Goal: 0 (02/24/21 1137)  Complications: No complications documented.

## 2021-02-24 NOTE — Progress Notes (Signed)
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 S/p flex sigmoidoscopy which showed severe ischemic colitis w/ multiple ulcerations, biopsies taken. D/c cipro, flagyl as per GI. C. diff and GI PCR panel were both neg. Advance to soft diet. GI following and recs apprec   AKI: resolved  Syncope: likely from dehydration from above. Continue on tele  Leukocytosis: likely secondary to colitis/enteritis. Continue on IV abxs  HTN: continue to hold lisinopril  Hypokalemia: KCl repleated. Will continue to monitor   Microcytic anemia: will check iron panel. No need for a transfusion currently    DVT prophylaxis: lovenox  Code Status: full  Family Communication:  Disposition Plan: likely d/c back home   Level of care: Progressive Cardiac   Status is: Inpatient  Remains inpatient appropriate because:Ongoing diagnostic testing needed not appropriate for outpatient work up, Unsafe d/c plan, IV treatments appropriate due to intensity of illness or inability to take PO and Inpatient level of care appropriate due to severity of illness   Dispo: The patient is from: Home              Anticipated d/c is to: Home              Patient currently is not medically stable to d/c.   Difficult to place patient Yes        Consultants:   GI   General surg    Procedures:    Antimicrobials:   Subjective: Pt c/o abd pain   Objective: Vitals:   02/23/21 1142 02/23/21 1541 02/23/21 1908 02/24/21 0324  BP: (!) 149/77  124/90 (!) 143/70 (!) 148/78  Pulse: 99 (!) 103 94 93  Resp: 20 20 18    Temp: 99.6 F (37.6 C) 99.2 F (37.3 C) 98.8 F (37.1 C)   TempSrc: Oral Oral    SpO2: 100% 100% 97% 96%  Weight:      Height:        Intake/Output Summary (Last 24 hours) at 02/24/2021 0838 Last data filed at 02/24/2021 0455 Gross per 24 hour  Intake 2040 ml  Output 400 ml  Net 1640 ml   Filed Weights   02/20/21 0947 02/21/21 0420 02/23/21 0500  Weight: 75.6 kg 73.9 kg 80.3 kg    Examination:  General exam: Appears calm and comfortable  Respiratory system: Clear to auscultation. Respiratory effort normal. Cardiovascular system: S1 & S2 +. No rubs, gallops or clicks.  Gastrointestinal system: Abdomen is nondistended, soft and nontender.  Normal bowel sounds heard. Central nervous system: Alert and oriented. Moves all 4 extremities  Psychiatry: Judgement and insight appear normal. Mood & affect appropriate.     Data Reviewed: I have personally reviewed following labs and imaging studies  CBC: Recent Labs  Lab 02/19/21 2028 02/20/21 0414 02/21/21 0520 02/22/21 0448 02/23/21 0511 02/24/21 0504  WBC 12.3* 27.9* 29.8* 27.9* 26.4* 15.0*  NEUTROABS 4.3  --   --   --   --   --   HGB 11.9* 11.2* 11.8* 10.6* 8.5* 8.7*  HCT 39.4 37.4 38.6  34.2* 26.7* 27.6*  MCV 81.7 83.7 83.0 81.2 79.5* 80.0  PLT 506* 408* 334 269 221 214   Basic Metabolic Panel: Recent Labs  Lab 02/19/21 2028 02/20/21 0414 02/21/21 0520 02/22/21 0448 02/23/21 0511 02/24/21 0504  NA 139 139 140 139 136 138  K 2.9* 4.1 4.0 3.9 3.1* 3.1*  CL 102 106 106 107 103 106  CO2 25 22 24 25 25 27   GLUCOSE 174* 156* 126* 111* 88 100*  BUN 25* 23 12 9 10  5*  CREATININE 1.69* 1.34*  1.33* 0.88 0.78 0.81 0.82  CALCIUM 9.2 8.4* 8.7* 8.4* 8.4* 8.5*  MG 1.7 1.8  --   --   --   --    GFR: Estimated Creatinine Clearance: 70.2 mL/min (by C-G formula based on SCr of 0.82 mg/dL). Liver Function Tests: Recent Labs  Lab 02/19/21 2028  02/22/21 0448 02/23/21 0511 02/24/21 0504  AST 34 15 15 38  ALT 20 12 10 16   ALKPHOS 111 68 77 76  BILITOT 0.6 0.7 0.7 0.5  PROT 8.4* 6.2* 6.1* 6.4*  ALBUMIN 4.1 3.2* 3.2* 3.3*   Recent Labs  Lab 02/19/21 2028  LIPASE 33   No results for input(s): AMMONIA in the last 168 hours. Coagulation Profile: No results for input(s): INR, PROTIME in the last 168 hours. Cardiac Enzymes: No results for input(s): CKTOTAL, CKMB, CKMBINDEX, TROPONINI in the last 168 hours. BNP (last 3 results) No results for input(s): PROBNP in the last 8760 hours. HbA1C: No results for input(s): HGBA1C in the last 72 hours. CBG: No results for input(s): GLUCAP in the last 168 hours. Lipid Profile: No results for input(s): CHOL, HDL, LDLCALC, TRIG, CHOLHDL, LDLDIRECT in the last 72 hours. Thyroid Function Tests: No results for input(s): TSH, T4TOTAL, FREET4, T3FREE, THYROIDAB in the last 72 hours. Anemia Panel: No results for input(s): VITAMINB12, FOLATE, FERRITIN, TIBC, IRON, RETICCTPCT in the last 72 hours. Sepsis Labs: No results for input(s): PROCALCITON, LATICACIDVEN in the last 168 hours.  Recent Results (from the past 240 hour(s))  Urine culture     Status: Abnormal   Collection Time: 02/19/21  8:28 PM   Specimen: Urine, Random  Result Value Ref Range Status   Specimen Description   Final    URINE, RANDOM Performed at [facility], [address] [place], [facility] [address]    Special Requests   Final    NONE Performed at [facility], [address] [place], [facility] [address]    Culture MULTIPLE SPECIES PRESENT, SUGGEST RECOLLECTION (A)  Final   Report Status 02/21/2021 FINAL  Final  Resp Panel by RT-PCR (Flu A&B, Covid) Urine, Clean Catch     Status: None   Collection Time: 02/19/21  9:07 PM   Specimen: Urine, Clean Catch; Nasopharyngeal(NP) swabs in vial transport medium  Result Value Ref Range Status   SARS Coronavirus 2 by RT PCR NEGATIVE NEGATIVE Final     Comment: (NOTE) SARS-CoV-2 target nucleic acids are NOT DETECTED.  The SARS-CoV-2 RNA is generally detectable in upper respiratory specimens during the acute phase of infection. The lowest concentration of SARS-CoV-2 viral copies this assay can detect is 138 copies/mL. A negative result does not preclude SARS-Cov-2 infection and should not be used as the sole basis for treatment or other patient management decisions. A negative result may occur with  improper specimen collection/handling, submission of specimen other than nasopharyngeal swab, presence of viral mutation(s) within the areas targeted by this assay, and inadequate number of  viral copies(<138 copies/mL). A negative result must be combined with clinical observations, patient history, and epidemiological information. The expected result is Negative.  Fact Sheet for Patients:  BloggerCourse.com  Fact Sheet for Healthcare Providers:  SeriousBroker.it  This test is no t yet approved or cleared by the [place] FDA and  has been authorized for detection and/or diagnosis of SARS-CoV-2 by FDA under an Emergency Use Authorization (EUA). This EUA will remain  in effect (meaning this test can be used) for the duration of the COVID-19 declaration under Section 564(b)(1) of the Act, 21 U.S.C.section 360bbb-3(b)(1), unless the authorization is terminated  or revoked sooner.       Influenza A by PCR NEGATIVE NEGATIVE Final   Influenza B by PCR NEGATIVE NEGATIVE Final    Comment: (NOTE) The Xpert Xpress SARS-CoV-2/FLU/RSV plus assay is intended as an aid in the diagnosis of influenza from Nasopharyngeal swab specimens and should not be used as a sole basis for treatment. Nasal washings and aspirates are unacceptable for Xpert Xpress SARS-CoV-2/FLU/RSV testing.  Fact Sheet for Patients: BloggerCourse.com  Fact Sheet for Healthcare  Providers: SeriousBroker.it  This test is not yet approved or cleared by the [place] FDA and has been authorized for detection and/or diagnosis of SARS-CoV-2 by FDA under an Emergency Use Authorization (EUA). This EUA will remain in effect (meaning this test can be used) for the duration of the COVID-19 declaration under Section 564(b)(1) of the Act, 21 U.S.C. section 360bbb-3(b)(1), unless the authorization is terminated or revoked.  Performed at [facility], [address] [place], [place] [address]   Gastrointestinal Panel by PCR , Stool     Status: None   Collection Time: 02/19/21 10:50 PM   Specimen: Stool  Result Value Ref Range Status   Campylobacter species NOT DETECTED NOT DETECTED Final   Plesimonas shigelloides NOT DETECTED NOT DETECTED Final   Salmonella species NOT DETECTED NOT DETECTED Final   Yersinia enterocolitica NOT DETECTED NOT DETECTED Final   Vibrio species NOT DETECTED NOT DETECTED Final   Vibrio cholerae NOT DETECTED NOT DETECTED Final   Enteroaggregative E coli (EAEC) NOT DETECTED NOT DETECTED Final   Enteropathogenic E coli (EPEC) NOT DETECTED NOT DETECTED Final   Enterotoxigenic E coli (ETEC) NOT DETECTED NOT DETECTED Final   Shiga like toxin producing E coli (STEC) NOT DETECTED NOT DETECTED Final   Shigella/Enteroinvasive E coli (EIEC) NOT DETECTED NOT DETECTED Final   Cryptosporidium NOT DETECTED NOT DETECTED Final   Cyclospora cayetanensis NOT DETECTED NOT DETECTED Final   Entamoeba histolytica NOT DETECTED NOT DETECTED Final   Giardia lamblia NOT DETECTED NOT DETECTED Final   Adenovirus F40/41 NOT DETECTED NOT DETECTED Final   Astrovirus NOT DETECTED NOT DETECTED Final   Norovirus GI/GII NOT DETECTED NOT DETECTED Final   Rotavirus A NOT DETECTED NOT DETECTED Final   Sapovirus (I, II, IV, and V) NOT DETECTED NOT DETECTED Final    Comment: Performed at [facility], [address]  [place], [place] [address]  C Difficile Quick Screen w PCR reflex     Status: None   Collection Time: 02/19/21 10:50 PM   Specimen: Stool  Result Value Ref Range Status   C Diff antigen NEGATIVE NEGATIVE Final   C Diff toxin NEGATIVE NEGATIVE Final   C Diff interpretation No C. difficile detected.  Final    Comment: Performed at [facility], [address], [place], [place] [address]         Radiology Studies:  CT ABDOMEN PELVIS W CONTRAST  Result Date: 02/22/2021 CLINICAL DATA:  Diarrhea, lower GI bleed.  Enteritis suspected. EXAM: CT ABDOMEN AND PELVIS WITH CONTRAST TECHNIQUE: Multidetector CT imaging of the abdomen and pelvis was performed using the standard protocol following bolus administration of intravenous contrast. CONTRAST:  OMNIPAQUE IOHEXOL 300 MG/ML SOLN. The patient was pre-medicated with emergent prep or prior contrast reaction. No immediate breakthrough reaction following the procedure. COMPARISON:  02/19/2021 FINDINGS: Lower chest: Bronchiectasis and scarring in the lung bases, stable. No effusions. Hepatobiliary: Biliary ductal dilatation with common bile duct measuring up to 13 mm, stable. Prior cholecystectomy. No suspicious focal hepatic abnormality. Pancreas: No focal abnormality or ductal dilatation. Spleen: No focal abnormality.  Normal size. Adrenals/Urinary Tract: No adrenal abnormality. No focal renal abnormality. No stones or hydronephrosis. Urinary bladder is unremarkable. Stomach/Bowel: Normal appendix. There is wall thickening involving the distal transverse colon, splenic flexure, and much of the descending colon compatible with colitis. Mild gaseous distention of the colon proximal to the area of wall thickening with layering fluid. Small bowel and stomach decompressed. Sigmoid diverticulosis. Vascular/Lymphatic: Scattered aortic calcifications. Mesenteric vessels appear patent without significant disease. No evidence of aneurysm or adenopathy.  Reproductive: Prior hysterectomy.  No adnexal masses. Other: Small to moderate free fluid in the pelvis.  No free air. Musculoskeletal: No acute bony abnormality. IMPRESSION: Wall thickening involving the colon from the distal transverse colon through the descending colon compatible with colitis. Differential considerations would include infectious, inflammatory, or ischemic causes. No visible significant vascular disease. Prior cholecystectomy with intrahepatic and extrahepatic biliary duct dilatation, likely related to post cholecystectomy state. Small to moderate free fluid in the pelvis. Sigmoid diverticulosis. Electronically Signed   By: Charlett Nose M.D.   On: 02/22/2021 18:48        Scheduled Meds: . diphenhydrAMINE  50 mg Oral Once   Or  . diphenhydrAMINE  50 mg Intravenous Once  . enoxaparin (LOVENOX) injection  40 mg Subcutaneous Q24H  . pantoprazole (PROTONIX) IV  40 mg Intravenous Q24H  . sertraline  150 mg Oral Daily   Continuous Infusions: . ciprofloxacin 400 mg (02/23/21 2138)  . lactated ringers 100 mL/hr at 02/22/21 2205  . metronidazole 500 mg (02/24/21 0321)  . promethazine (PHENERGAN) injection       LOS: 4 days    Time spent: 34 mins     Charise Killian, MD [facility] Pager [phone]  If 7PM-7AM, please contact night-coverage 02/24/2021, 8:38 AM \

## 2021-02-24 NOTE — Anesthesia Preprocedure Evaluation (Signed)
Anesthesia Evaluation  Patient identified by MRN, date of birth, ID band Patient awake    Reviewed: Allergy & Precautions, H&P , NPO status , Patient's Chart, lab work & pertinent test results  History of Anesthesia Complications Negative for: history of anesthetic complications  Airway Mallampati: II       Dental  (+) Edentulous Lower, Edentulous Upper   Pulmonary neg sleep apnea, neg COPD,  Interstitial lung disease, wears 2-3L [place] PRN at home     + decreased breath sounds      Cardiovascular hypertension, (-) angina(-) Past MI and (-) Cardiac Stents + dysrhythmias (SVT s/p ablation)  Rhythm:regular Rate:Normal     Neuro/Psych PSYCHIATRIC DISORDERS Anxiety negative neurological ROS     GI/Hepatic negative GI ROS, Neg liver ROS,   Endo/Other  negative endocrine ROS  Renal/GU negative Renal ROS  negative genitourinary   Musculoskeletal  (+) Arthritis , Rheumatoid disorders,    Abdominal   Peds  Hematology  (+) Blood dyscrasia, anemia ,   Anesthesia Other Findings Past Medical History: No date: Degenerative joint disease No date: Hypertension No date: Interstitial lung disease (HCC) No date: Microcytic anemia No date: Mitral valve prolapse No date: RA (rheumatoid arthritis) (HCC)  Past Surgical History: No date: ABDOMINAL HYSTERECTOMY No date: CHOLECYSTECTOMY No date: ELBOW SURGERY No date: FOOT SURGERY No date: KNEE SURGERY No date: SHOULDER SURGERY No date: TOTAL HIP ARTHROPLASTY  BMI    Body Mass Index: 28.57 kg/m      Reproductive/Obstetrics negative OB ROS                             Anesthesia Physical Anesthesia Plan  ASA: III  Anesthesia Plan: General   Post-op Pain Management:    Induction:   PONV Risk Score and Plan: Propofol infusion and TIVA  Airway Management Planned:   Additional Equipment:   Intra-op Plan:   Post-operative Plan:   Informed  Consent: I have reviewed the patients History and Physical, chart, labs and discussed the procedure including the risks, benefits and alternatives for the proposed anesthesia with the patient or authorized representative who has indicated his/her understanding and acceptance.     Dental Advisory Given  Plan Discussed with: Anesthesiologist, CRNA and Surgeon  Anesthesia Plan Comments:         Anesthesia Quick Evaluation

## 2021-02-24 NOTE — Progress Notes (Signed)
Mobility Specialist - Progress Note   02/24/21 1500  Mobility  Activity Refused mobility  Mobility performed by Mobility specialist    2nd attempt this date. Pt out for procedure and now declining politely as she would like to rest. Will attempt session next available date. Family at bedside.    Filiberto Pinks Mobility Specialist 02/24/21, 3:55 PM

## 2021-02-24 NOTE — Anesthesia Postprocedure Evaluation (Signed)
Anesthesia Post Note  Patient: Bridget Woodard  Procedure(s) Performed: FLEXIBLE SIGMOIDOSCOPY (N/A )  Patient location during evaluation: PACU Anesthesia Type: General Level of consciousness: awake and alert Pain management: pain level controlled Vital Signs Assessment: post-procedure vital signs reviewed and stable Respiratory status: spontaneous breathing, nonlabored ventilation and respiratory function stable Cardiovascular status: blood pressure returned to baseline and stable Postop Assessment: no apparent nausea or vomiting Anesthetic complications: no   No complications documented.   Last Vitals:  Vitals:   02/24/21 1327 02/24/21 1404  BP: 136/72 137/71  Pulse: 80 78  Resp: 17 16  Temp:  37.2 C  SpO2: 99% 97%    Last Pain:  Vitals:   02/24/21 1404  TempSrc: Oral  PainSc:                  Karleen Hampshire

## 2021-02-24 NOTE — Plan of Care (Signed)

## 2021-02-25 ENCOUNTER — Encounter: Payer: Self-pay | Admitting: Internal Medicine

## 2021-02-25 LAB — CBC
HCT: 28.5 % — ABNORMAL LOW (ref 36.0–46.0)
Hemoglobin: 8.9 g/dL — ABNORMAL LOW (ref 12.0–15.0)
MCH: 25.1 pg — ABNORMAL LOW (ref 26.0–34.0)
MCHC: 31.2 g/dL (ref 30.0–36.0)
MCV: 80.3 fL (ref 80.0–100.0)
Platelets: 237 10*3/uL (ref 150–400)
RBC: 3.55 MIL/uL — ABNORMAL LOW (ref 3.87–5.11)
RDW: 15.3 % (ref 11.5–15.5)
WBC: 12.4 10*3/uL — ABNORMAL HIGH (ref 4.0–10.5)
nRBC: 0 % (ref 0.0–0.2)

## 2021-02-25 LAB — BASIC METABOLIC PANEL
Anion gap: 8 (ref 5–15)
BUN: <5 mg/dL — ABNORMAL LOW (ref 8–23)
CO2: 27 mmol/L (ref 22–32)
Calcium: 8.3 mg/dL — ABNORMAL LOW (ref 8.9–10.3)
Chloride: 103 mmol/L (ref 98–111)
Creatinine, Ser: 0.85 mg/dL (ref 0.44–1.00)
GFR, Estimated: >60 mL/min (ref >60)
Glucose, Bld: 100 mg/dL — ABNORMAL HIGH (ref 70–99)
Potassium: 2.9 mmol/L — ABNORMAL LOW (ref 3.5–5.1)
Sodium: 138 mmol/L (ref 135–145)

## 2021-02-25 LAB — IRON AND TIBC
Iron: 51 ug/dL (ref 28–170)
Saturation Ratios: 18 % (ref 10.4–31.8)
TIBC: 288 ug/dL (ref 250–450)
UIBC: 237 ug/dL

## 2021-02-25 LAB — FERRITIN: Ferritin: 112 ng/mL (ref 11–307)

## 2021-02-25 MED ORDER — POTASSIUM CHLORIDE CRYS ER 20 MEQ PO TBCR
40.0000 meq | EXTENDED_RELEASE_TABLET | Freq: Two times a day (BID) | ORAL | Status: AC
  Administered 2021-02-25 (×2): 40 meq via ORAL
  Filled 2021-02-25 (×3): qty 2

## 2021-02-25 NOTE — Care Management Important Message (Signed)
Important Message  Patient Details  Name: Bridget Woodard MRN: [id number] Date of Birth: November 05, 1951   Medicare Important Message Given:  Yes     Johnell Comings 02/25/2021, 12:56 PM

## 2021-02-25 NOTE — Progress Notes (Signed)
Mobility Specialist - Progress Note   02/25/21 1200  Mobility  Activity Ambulated in hall  Level of Assistance Modified independent, requires aide device or extra time  Assistive Device Front wheel walker  Distance Ambulated (ft) 370 ft  Mobility Response Tolerated well  Mobility performed by Mobility specialist  $Mobility charge 1 Mobility    During mobility: 102 HR, 97% SpO2 Post-mobility: 104 HR, 96% SpO2   Pt ambulated in hallway with RW. No LOB. Pt c/o pain at incision site prior to activity "5/10" that did not increase or decrease with activity. 1 short rest break taken as pt stated she was "trying to catch her breath, think I was moving too fast". O2 maintained high 90s throughout ambulation. Pt pleased with her performance this date voicing that this is the furthest she's walked since admission. RPE 2/10. Feeling mildly winded once returned to room. Family at bedside.    Filiberto Pinks Mobility Specialist 02/25/21, 12:22 PM

## 2021-02-25 NOTE — Progress Notes (Signed)
PROGRESS NOTE    HPI was taken from Dr. Para March: Bridget Woodard is a 70 y.o. 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  Has associated mild colicky abdominal pain, with no aggravating or alleviating factors.,  Denies fever or chills.  No affected contacts and no offending foods.  History limited due to lethargy. ED course: On arrival BP 114/65, pulse 115, temp 97.6, O2 sat 99% on room air.  Blood work significant for leukocytosis of 12,000, creatinine of 1.69, potassium 2.9.  Troponin normal at 14.  Urinalysis with large leukocyte esterase.  GI panel and stool for C. difficile pending EKG, personally reviewed and interpreted: Sinus tachycardia at 113 with nonspecific ST-T wave changes Imaging: CT abdomen and pelvis consistent with diarrheal illness and probable enteritis CT head and C-spine no acute findings  Patient was hydrated in the emergency room, given potassium and magnesium repletion.  Hospitalist consulted for admission.  Hospital course from Dr. Mayford Knife 4/6-02/25/21: Pt presented w/ abd pain, bloody diarrhea which was secondary to ischemic colitis as found on sigmoidoscopy. Pt denies any bloody stools today so far and H&H are stable. Continue w/ supportive care. For more information, please see previous progress/consult notes.    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 Pt denies any bloody stools today so far. S/p flex sigmoidoscopy which showed severe ischemic colitis w/ multiple ulcerations, biopsies taken. D/c cipro, flagyl as per GI. C. diff and GI PCR panel were both neg. Advance to soft diet. GI following and recs apprec   AKI: resolved  Syncope: likely from dehydration from above. Continue on tele  Leukocytosis: likely secondary to ischemic colitis, trending down   HTN: can restart lisinopril tomorrow   Hypokalemia: potassium repeated. Will continue to monitor   Microcytic anemia: iron is WNL H&H are stable   DVT prophylaxis: lovenox  Code Status: full  Family Communication: discussed pt's care w/ pt's husband and answered his questions. Please call the pt's husband, Jerolyn Shin, daily for updates  Disposition Plan: likely d/c back home   Level of care: Progressive Cardiac   Status is: Inpatient  Remains inpatient appropriate because:Ongoing diagnostic testing needed not appropriate for outpatient work up, Unsafe d/c plan, IV treatments appropriate due to intensity of illness or inability to take PO and Inpatient level of care appropriate due to severity of illness can likely d/c back home tomorrow if no more bloody stools and H&H are stable    Dispo: The patient is from: Home              Anticipated d/c is to: Home              Patient currently is not medically stable to d/c.   Difficult to place patient Yes        Consultants:   GI   General surg    Procedures:  Antimicrobials:   Subjective: Pt c/o abd pain but improved from day prior   Objective: Vitals:   02/24/21 1404 02/24/21 1637 02/24/21 1933 02/25/21 0440  BP: 137/71 135/68 138/68 139/73  Pulse: 78 85 95 88  Resp: 16 18 17 16   Temp: 98.9 F (37.2 C) 98.3 F (36.8 C) 98.7 F (37.1 C) 98.6 F (37 C)  TempSrc: Oral   Oral  SpO2: 97% 99% 97% 93%  Weight:      Height:        Intake/Output Summary (Last 24  hours) at 02/25/2021 0805 Last data filed at 02/25/2021 0300 Gross per 24 hour  Intake 2671.14 ml  Output --  Net 2671.14 ml   Filed Weights   02/20/21 0947 02/21/21 0420 02/23/21 0500  Weight: 75.6 kg 73.9 kg 80.3 kg    Examination:  General exam: Appears comfortable  Respiratory system: clear breath sounds b/l  Cardiovascular system: S1/S2+. No rubs or clicks  Gastrointestinal system: Abd is soft, ND, & hyperactive bowel sounds  Central nervous system: Alert and oriented. Moves all extremities  Psychiatry: Judgement and insight appear normal. Flat mood and affect      Data Reviewed: I have personally reviewed following labs and imaging studies  CBC: Recent Labs  Lab 02/19/21 2028 02/20/21 0414 02/21/21 0520 02/22/21 0448 02/23/21 0511 02/24/21 0504 02/25/21 0508  WBC 12.3*   < > 29.8* 27.9* 26.4* 15.0* 12.4*  NEUTROABS 4.3  --   --   --   --   --   --   HGB 11.9*   < > 11.8* 10.6* 8.5* 8.7* 8.9*  HCT 39.4   < > 38.6 34.2* 26.7* 27.6* 28.5*  MCV 81.7   < > 83.0 81.2 79.5* 80.0 80.3  PLT 506*   < > 334 269 221 214 237   < > = values in this interval not displayed.   Basic Metabolic Panel: Recent Labs  Lab 02/19/21 2028 02/20/21 0414 02/21/21 0520 02/22/21 0448 02/23/21 0511 02/24/21 0504 02/25/21 0508  NA 139 139 140 139 136 138 138  K 2.9* 4.1 4.0 3.9 3.1* 3.1* 2.9*  CL 102 106 106 107 103 106 103  CO2 25 22 24 25 25 27 27   GLUCOSE 174* 156* 126* 111* 88 100* 100*  BUN 25* 23 12 9 10  5* <5*  CREATININE 1.69* 1.34*  1.33* 0.88 0.78 0.81 0.82 0.85  CALCIUM 9.2 8.4* 8.7* 8.4* 8.4* 8.5* 8.3*  MG 1.7 1.8  --   --   --   --   --    GFR: Estimated Creatinine Clearance: 67.7 mL/min (by C-G formula based on SCr of 0.85 mg/dL). Liver Function Tests: Recent Labs  Lab 02/19/21 2028 02/22/21 0448 02/23/21 0511 02/24/21 0504  AST 34 15 15 38  ALT 20 12 10 16   ALKPHOS 111 68 77 76  BILITOT 0.6 0.7 0.7 0.5  PROT 8.4* 6.2* 6.1* 6.4*  ALBUMIN 4.1 3.2* 3.2*  3.3*   Recent Labs  Lab 02/19/21 2028  LIPASE 33   No results for input(s): AMMONIA in the last 168 hours. Coagulation Profile: No results for input(s): INR, PROTIME in the last 168 hours. Cardiac Enzymes: No results for input(s): CKTOTAL, CKMB, CKMBINDEX, TROPONINI in the last 168 hours. BNP (last 3 results) No results for input(s): PROBNP in the last 8760 hours. HbA1C: No results for input(s): HGBA1C in the last 72 hours. CBG: No results for input(s): GLUCAP in the last 168 hours. Lipid Profile: No results  for input(s): CHOL, HDL, LDLCALC, TRIG, CHOLHDL, LDLDIRECT in the last 72 hours. Thyroid Function Tests: No results for input(s): TSH, T4TOTAL, FREET4, T3FREE, THYROIDAB in the last 72 hours. Anemia Panel: Recent Labs    02/25/21 0508  FERRITIN 112  TIBC 288  IRON 51   Sepsis Labs: No results for input(s): PROCALCITON, LATICACIDVEN in the last 168 hours.  Recent Results (from the past 240 hour(s))  Urine culture     Status: Abnormal   Collection Time: 02/19/21  8:28 PM   Specimen: Urine, Random  Result Value Ref Range Status   Specimen Description   Final    URINE, RANDOM Performed at [facility], [address] [place], [place] [address]    Special Requests   Final    NONE Performed at [facility], [address] [place], [place] [address]    Culture MULTIPLE SPECIES PRESENT, SUGGEST RECOLLECTION (A)  Final   Report Status 02/21/2021 FINAL  Final  Resp Panel by RT-PCR (Flu A&B, Covid) Urine, Clean Catch     Status: None   Collection Time: 02/19/21  9:07 PM   Specimen: Urine, Clean Catch; Nasopharyngeal(NP) swabs in vial transport medium  Result Value Ref Range Status   SARS Coronavirus 2 by RT PCR NEGATIVE NEGATIVE Final    Comment: (NOTE) SARS-CoV-2 target nucleic acids are NOT DETECTED.  The SARS-CoV-2 RNA is generally detectable in upper respiratory specimens during the acute phase of infection. The lowest concentration of  SARS-CoV-2 viral copies this assay can detect is 138 copies/mL. A negative result does not preclude SARS-Cov-2 infection and should not be used as the sole basis for treatment or other patient management decisions. A negative result may occur with  improper specimen collection/handling, submission of specimen other than nasopharyngeal swab, presence of viral mutation(s) within the areas targeted by this assay, and inadequate number of viral copies(<138 copies/mL). A negative result must be combined with clinical observations, patient history, and epidemiological information. The expected result is Negative.  Fact Sheet for Patients:  BloggerCourse.com  Fact Sheet for Healthcare Providers:  SeriousBroker.it  This test is no t yet approved or cleared by the [place] FDA and  has been authorized for detection and/or diagnosis of SARS-CoV-2 by FDA under an Emergency Use Authorization (EUA). This EUA will remain  in effect (meaning this test can be used) for the duration of the COVID-19 declaration under Section 564(b)(1) of the Act, 21 U.S.C.section 360bbb-3(b)(1), unless the authorization is terminated  or revoked sooner.       Influenza A by PCR NEGATIVE NEGATIVE Final   Influenza B by PCR NEGATIVE NEGATIVE Final    Comment: (NOTE) The Xpert Xpress SARS-CoV-2/FLU/RSV plus assay is intended as an aid in the diagnosis of influenza from Nasopharyngeal swab specimens and should not be used as a sole basis for treatment. Nasal washings and aspirates are unacceptable for Xpert Xpress SARS-CoV-2/FLU/RSV testing.  Fact Sheet for Patients: BloggerCourse.com  Fact Sheet for Healthcare Providers: SeriousBroker.it  This test is not yet approved or cleared by the [place] FDA and has been authorized for detection and/or diagnosis of SARS-CoV-2 by FDA under an Emergency Use  Authorization (EUA). This EUA will remain in effect (meaning this test can be used) for the duration of the COVID-19 declaration under Section 564(b)(1) of the Act, 21 U.S.C. section 360bbb-3(b)(1), unless the authorization is terminated or revoked.  Performed at [facility], [address], [place], [place] [address]   Gastrointestinal Panel by PCR ,  Stool     Status: None   Collection Time: 02/19/21 10:50 PM   Specimen: Stool  Result Value Ref Range Status   Campylobacter species NOT DETECTED NOT DETECTED Final   Plesimonas shigelloides NOT DETECTED NOT DETECTED Final   Salmonella species NOT DETECTED NOT DETECTED Final   Yersinia enterocolitica NOT DETECTED NOT DETECTED Final   Vibrio species NOT DETECTED NOT DETECTED Final   Vibrio cholerae NOT DETECTED NOT DETECTED Final   Enteroaggregative E coli (EAEC) NOT DETECTED NOT DETECTED Final   Enteropathogenic E coli (EPEC) NOT DETECTED NOT DETECTED Final   Enterotoxigenic E coli (ETEC) NOT DETECTED NOT DETECTED Final   Shiga like toxin producing E coli (STEC) NOT DETECTED NOT DETECTED Final   Shigella/Enteroinvasive E coli (EIEC) NOT DETECTED NOT DETECTED Final   Cryptosporidium NOT DETECTED NOT DETECTED Final   Cyclospora cayetanensis NOT DETECTED NOT DETECTED Final   Entamoeba histolytica NOT DETECTED NOT DETECTED Final   Giardia lamblia NOT DETECTED NOT DETECTED Final   Adenovirus F40/41 NOT DETECTED NOT DETECTED Final   Astrovirus NOT DETECTED NOT DETECTED Final   Norovirus GI/GII NOT DETECTED NOT DETECTED Final   Rotavirus A NOT DETECTED NOT DETECTED Final   Sapovirus (I, II, IV, and V) NOT DETECTED NOT DETECTED Final    Comment: Performed at [facility], [address] [place], [place] [address]  C Difficile Quick Screen w PCR reflex     Status: None   Collection Time: 02/19/21 10:50 PM   Specimen: Stool  Result Value Ref Range Status   C Diff antigen NEGATIVE NEGATIVE Final   C Diff toxin NEGATIVE  NEGATIVE Final   C Diff interpretation No C. difficile detected.  Final    Comment: Performed at [facility], [address], [place], [place] [address]         Radiology Studies: No results found.      Scheduled Meds: . diphenhydrAMINE  50 mg Oral Once   Or  . diphenhydrAMINE  50 mg Intravenous Once  . enoxaparin (LOVENOX) injection  40 mg Subcutaneous Q24H  . pantoprazole (PROTONIX) IV  40 mg Intravenous Q24H  . sertraline  150 mg Oral Daily   Continuous Infusions: . lactated ringers 100 mL/hr at 02/25/21 0330  . promethazine (PHENERGAN) injection       LOS: 5 days    Time spent: 30 mins     Charise Killian, MD [facility] Pager [phone]  If 7PM-7AM, please contact night-coverage 02/25/2021, 8:05 AM \

## 2021-02-25 NOTE — Progress Notes (Signed)
[facility] Gastroenterology Inpatient Progress Note  Subjective: Patient seen for follow up ischemic colitis. Biopsies returned from flexible sigmoidoscopy reflect features consistent with ischemic colitis. Diarrhea is resolving, abdominal pain is much improved. Tolerating mechanical soft diet.  Objective: Vital signs in last 24 hours: Temp:  [98.3 F (36.8 C)-99.1 F (37.3 C)] 99.1 F (37.3 C) (04/07 1109) Pulse Rate:  [78-95] 94 (04/07 1109) Resp:  [16-18] 18 (04/07 1109) BP: (131-139)/(68-73) 131/71 (04/07 1109) SpO2:  [93 %-99 %] 97 % (04/07 1109) Blood pressure 131/71, pulse 94, temperature 99.1 F (37.3 C), temperature source Oral, resp. rate 18, height 5\' 6"  (1.676 m), weight 80.3 kg, SpO2 97 %.    Intake/Output from previous day: 04/06 0701 - 04/07 0700 In: 2671.1 [P.O.:240; I.V.:1931.1; IV Piggyback:500] Out: -   Intake/Output this shift: Total I/O In: 738 [I.V.:738] Out: -    General appearance:  Alert, NAD Resp:  CTA Cardio:  RRR GI:  Soft benign, non-tender. BS+ Extremities: No edema.   Lab Results: Results for orders placed or performed during the hospital encounter of 02/19/21 (from the past 24 hour(s))  Iron and TIBC     Status: None   Collection Time: 02/25/21  5:08 AM  Result Value Ref Range   Iron 51 28 - 170 ug/dL   TIBC 04/27/21 707 - 867 ug/dL   Saturation Ratios 18 10.4 - 31.8 %   UIBC 237 ug/dL  Ferritin     Status: None   Collection Time: 02/25/21  5:08 AM  Result Value Ref Range   Ferritin 112 11 - 307 ng/mL  CBC     Status: Abnormal   Collection Time: 02/25/21  5:08 AM  Result Value Ref Range   WBC 12.4 (H) 4.0 - 10.5 K/uL   RBC 3.55 (L) 3.87 - 5.11 MIL/uL   Hemoglobin 8.9 (L) 12.0 - 15.0 g/dL   HCT 04/27/21 (L) 92.0 - 10.0 %   MCV 80.3 80.0 - 100.0 fL   MCH 25.1 (L) 26.0 - 34.0 pg   MCHC 31.2 30.0 - 36.0 g/dL   RDW 71.2 19.7 - 58.8 %   Platelets 237 150 - 400 K/uL   nRBC 0.0 0.0 - 0.2 %  Basic metabolic panel     Status: Abnormal    Collection Time: 02/25/21  5:08 AM  Result Value Ref Range   Sodium 138 135 - 145 mmol/L   Potassium 2.9 (L) 3.5 - 5.1 mmol/L   Chloride 103 98 - 111 mmol/L   CO2 27 22 - 32 mmol/L   Glucose, Bld 100 (H) 70 - 99 mg/dL   BUN <5 (L) 8 - 23 mg/dL   Creatinine, Ser 04/27/21 0.44 - 1.00 mg/dL   Calcium 8.3 (L) 8.9 - 10.3 mg/dL   GFR, Estimated 4.98 >26 mL/min   Anion gap 8 5 - 15     Recent Labs    02/23/21 0511 02/24/21 0504 02/25/21 0508  WBC 26.4* 15.0* 12.4*  HGB 8.5* 8.7* 8.9*  HCT 26.7* 27.6* 28.5*  PLT 221 214 237   BMET Recent Labs    02/23/21 0511 02/24/21 0504 02/25/21 0508  NA 136 138 138  K 3.1* 3.1* 2.9*  CL 103 106 103  CO2 25 27 27   GLUCOSE 88 100* 100*  BUN 10 5* <5*  CREATININE 0.81 0.82 0.85  CALCIUM 8.4* 8.5* 8.3*   LFT Recent Labs    02/24/21 0504  PROT 6.4*  ALBUMIN 3.3*  AST 38  ALT 16  ALKPHOS 76  BILITOT 0.5   PT/INR No results for input(s): LABPROT, INR in the last 72 hours. Hepatitis Panel No results for input(s): HEPBSAG, HCVAB, HEPAIGM, HEPBIGM in the last 72 hours. C-Diff No results for input(s): CDIFFTOX in the last 72 hours. No results for input(s): CDIFFPCR in the last 72 hours.   Studies/Results: No results found.  Scheduled Inpatient Medications:   . diphenhydrAMINE  50 mg Oral Once   Or  . diphenhydrAMINE  50 mg Intravenous Once  . enoxaparin (LOVENOX) injection  40 mg Subcutaneous Q24H  . pantoprazole (PROTONIX) IV  40 mg Intravenous Q24H  . potassium chloride  40 mEq Oral BID  . sertraline  150 mg Oral Daily    Continuous Inpatient Infusions:   . lactated ringers 100 mL/hr at 02/25/21 1340  . promethazine (PHENERGAN) injection      PRN Inpatient Medications:  acetaminophen **OR** acetaminophen, alum & mag hydroxide-simeth, amitriptyline, morphine injection, ondansetron **OR** ondansetron (ZOFRAN) IV, promethazine (PHENERGAN) injection   Assessment:  1. Ischemic colitis - Clinically resolved. 2. Abdominal  pain secondary to #1 - essentially resolved. 3. Leukocytosis - Much improved (12.4). 4. F/E/N - Tolerating diet without difficulty. Still hypokalemic.  Plan:  1. Supplement fluid and electrolyte deficiencies as medically necessary. 2. Okay to discharge home thereafter from a GI standpoint. 3. Follow up with GI as needed. 4. I gave my business card to Mr. Doneta Bayman, patient's husband. He was welcomed to call with questions or to schedule an appointment as needed.  Nahmir Zeidman K. Norma Fredrickson, M.D. 02/25/2021, 1:54 PM

## 2021-02-26 LAB — BASIC METABOLIC PANEL
Anion gap: 7 (ref 5–15)
BUN: <5 mg/dL — ABNORMAL LOW (ref 8–23)
CO2: 25 mmol/L (ref 22–32)
Calcium: 8 mg/dL — ABNORMAL LOW (ref 8.9–10.3)
Chloride: 105 mmol/L (ref 98–111)
Creatinine, Ser: 0.75 mg/dL (ref 0.44–1.00)
GFR, Estimated: >60 mL/min (ref >60)
Glucose, Bld: 99 mg/dL (ref 70–99)
Potassium: 3.3 mmol/L — ABNORMAL LOW (ref 3.5–5.1)
Sodium: 137 mmol/L (ref 135–145)

## 2021-02-26 LAB — SURGICAL PATHOLOGY

## 2021-02-26 LAB — CBC
HCT: 25.8 % — ABNORMAL LOW (ref 36.0–46.0)
Hemoglobin: 7.9 g/dL — ABNORMAL LOW (ref 12.0–15.0)
MCH: 24.9 pg — ABNORMAL LOW (ref 26.0–34.0)
MCHC: 30.6 g/dL (ref 30.0–36.0)
MCV: 81.4 fL (ref 80.0–100.0)
Platelets: 232 10*3/uL (ref 150–400)
RBC: 3.17 MIL/uL — ABNORMAL LOW (ref 3.87–5.11)
RDW: 15.8 % — ABNORMAL HIGH (ref 11.5–15.5)
WBC: 11.9 10*3/uL — ABNORMAL HIGH (ref 4.0–10.5)
nRBC: 0 % (ref 0.0–0.2)

## 2021-02-26 LAB — MAGNESIUM: Magnesium: 1.5 mg/dL — ABNORMAL LOW (ref 1.7–2.4)

## 2021-02-26 MED ORDER — MAGNESIUM SULFATE 2 GM/50ML IV SOLN
2.0000 g | Freq: Once | INTRAVENOUS | Status: AC
  Administered 2021-02-26: 2 g via INTRAVENOUS
  Filled 2021-02-26: qty 50

## 2021-02-26 MED ORDER — POTASSIUM CHLORIDE CRYS ER 20 MEQ PO TBCR
40.0000 meq | EXTENDED_RELEASE_TABLET | Freq: Once | ORAL | Status: AC
  Administered 2021-02-26: 40 meq via ORAL
  Filled 2021-02-26: qty 2

## 2021-02-26 MED ORDER — PANTOPRAZOLE SODIUM 40 MG PO TBEC: 40.0000 mg | DELAYED_RELEASE_TABLET | Freq: Every day | ORAL | Status: DC

## 2021-02-26 NOTE — Plan of Care (Signed)
Pt is AAOx4. Pt medicated for pain in abd with morphine. Pt verbalized relief. All needs attended. Call light is within reach. Safety measures maintained.    Problem: Education: Goal: Knowledge of General Education information will improve Description: Including pain rating scale, medication(s)/side effects and non-pharmacologic comfort measures Outcome: Progressing   Problem: Health Behavior/Discharge Planning: Goal: Ability to manage health-related needs will improve Outcome: Progressing   Problem: Clinical Measurements: Goal: Ability to maintain clinical measurements within normal limits will improve Outcome: Progressing Goal: Will remain free from infection Outcome: Progressing Goal: Diagnostic test results will improve Outcome: Progressing Goal: Respiratory complications will improve Outcome: Progressing Goal: Cardiovascular complication will be avoided Outcome: Progressing   Problem: Activity: Goal: Risk for activity intolerance will decrease Outcome: Progressing   Problem: Nutrition: Goal: Adequate nutrition will be maintained Outcome: Progressing   Problem: Coping: Goal: Level of anxiety will decrease Outcome: Progressing   Problem: Elimination: Goal: Will not experience complications related to bowel motility Outcome: Progressing Goal: Will not experience complications related to urinary retention Outcome: Progressing   Problem: Pain Managment: Goal: General experience of comfort will improve Outcome: Progressing   Problem: Safety: Goal: Ability to remain free from injury will improve Outcome: Progressing   Problem: Skin Integrity: Goal: Risk for impaired skin integrity will decrease Outcome: Progressing   Problem: Education: Goal: Knowledge of condition and prescribed therapy will improve Outcome: Progressing   Problem: Cardiac: Goal: Will achieve and/or maintain adequate cardiac output Outcome: Progressing   Problem: Physical  Regulation: Goal: Complications related to the disease process, condition or treatment will be avoided or minimized Outcome: Progressing

## 2021-02-26 NOTE — Discharge Summary (Signed)
Bridget Woodard ASN:[id number] DOB: 02-12-1951 DOA: 02/19/2021  PCP: Patient, No Pcp Per (Inactive)  Admit date: 02/19/2021 Discharge date: 02/26/2021  Admitted From: home Disposition:  home  Recommendations for Outpatient Follow-up:  1. Follow up with PCP in 1 week 2. Please obtain BMP/CBC in one week 3. GI in 2 weeks      Discharge Condition:Stable CODE STATUS:full  Diet recommendation: Heart Healthy   Brief/Interim Summary: Per HPI: Bridget Woodard is a 70 y.o. 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  Has associated mild colicky abdominal pain, with no aggravating or alleviating factors.,  Denied fever or chills.  No affected contacts and no offending foods.    History on admission was limited due to patient being lethargic.  She was found with leukocytosis and sinus tachycardia.  She had a CT of abdomen and pelvis on admission with fluid-filled small and large bowel, consistent with diarrheal illness and probable enteritis.  CT of the head and C-spine was no acute bowel wall thickening.  No obstruction.  Pulmonary fibrosis at the lung bases.  She was started on IV fluids for hydration and was admitted to the hospital. Patient had another CT of abdomen pelvis on 02/22/2021 Wall thickening involving the colon from the distal transverse colon through the descending colon compatible with colitis. Differential considerations would include infectious, inflammatory, or ischemic causes. No visible significant vascular disease  Please note discharge summary based on chart review  Ischemic colitis: w/ bloody stools.  S/p flex sigmoidoscopy which showed severe ischemic colitis w/ multiple ulcerations, biopsies taken.  Was started on ciprofloxacin and Flagyl but discontinued as per GI.   C. diff  and GI PCR panel were both neg.  Was cleared for discharge from GI standpoint per GI    AKI: resolved  Syncope: likely from dehydration from above. Continue on tele  Leukocytosis: likely secondary to ischemic colitis, trending down   HTN:  Resume amlodipine.  Follow-up with PCP for further management  Hypokalemia: .  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 Get labs done   Increase activity slowly   Complete by: As directed      Allergies as of 02/26/2021      Reactions   Aspirin    Darvon [propoxyphene]    Iodine    Percocet [oxycodone-acetaminophen]    Shellfish Allergy    Sulfa Antibiotics       Medication List    STOP taking these  medications   Acetaminophen-Codeine 300-30 MG tablet   amoxicillin-clavulanate 875-125 MG tablet Commonly known as: AUGMENTIN   benzonatate 200 MG capsule Commonly known as: TESSALON   cephALEXin 500 MG capsule Commonly known as: KEFLEX   ciprofloxacin 500 MG tablet Commonly known as: CIPRO   cyclobenzaprine 5 MG tablet Commonly known as: FLEXERIL   diphenhydrAMINE 25 mg capsule Commonly known as: BENADRYL   enoxaparin 40  MG/0.4ML injection Commonly known as: LOVENOX   furosemide 20 MG tablet Commonly known as: LASIX   hydrochlorothiazide 25 MG tablet Commonly known as: HYDRODIURIL   HYDROmorphone 2 MG tablet Commonly known as: DILAUDID   HYDROmorphone 4 MG tablet Commonly known as: DILAUDID   lisinopril 20 MG tablet Commonly known as: ZESTRIL   methotrexate 2.5 MG tablet Commonly known as: RHEUMATREX   minocycline 100 MG capsule Commonly known as: MINOCIN   morphine 15 MG tablet Commonly known as: MSIR   potassium citrate 10 MEQ (1080 MG) SR tablet Commonly known as: UROCIT-K     TAKE these medications   albuterol 108 (90 Base) MCG/ACT inhaler Commonly known as: VENTOLIN HFA albuterol sulfate HFA 90 mcg/actuation aerosol inhaler   amitriptyline 50 MG tablet Commonly known as: ELAVIL Take by mouth.   amLODipine 10 MG tablet Commonly known as: NORVASC Take 1 tablet by mouth daily.   Anoro Ellipta 62.5-25 MCG/INH Aepb Generic drug: umeclidinium-vilanterol Inhale into the lungs.   diazepam 5 MG tablet Commonly known as: VALIUM Take 5 mg by mouth 2 (two) times daily as needed.   DULoxetine 30 MG capsule Commonly known as: CYMBALTA duloxetine 30 mg capsule,delayed release   fexofenadine 60 MG tablet Commonly known as: ALLEGRA fexofenadine 60 mg tablet   fluticasone 50 MCG/ACT nasal spray Commonly known as: FLONASE Place into the nose.   gabapentin 100 MG capsule Commonly known as: NEURONTIN gabapentin 100 mg capsule   HYDROcodone-acetaminophen 10-325 MG tablet Commonly known as: NORCO hydrocodone 10 mg-acetaminophen 325 mg tablet  Take 1 tablet 6 times a day by oral route as needed for 30 days.   leflunomide 20 MG tablet Commonly known as: ARAVA leflunomide 20 mg tablet  Take 1 tablet every day by oral route for 30 days.   ondansetron 4 MG disintegrating tablet Commonly known as: ZOFRAN-ODT ondansetron 4 mg disintegrating tablet   pantoprazole 40 MG  tablet Commonly known as: PROTONIX pantoprazole 40 mg tablet,delayed release   sertraline 100 MG tablet Commonly known as: ZOLOFT Take 1 tablet by mouth daily.       Follow-up Information    Tavernier, Boykin Nearing, MD Follow up in 2 week(s).   Specialty: Gastroenterology Contact information: [address] [place] [address] [phone]              Allergies  Allergen Reactions  . Aspirin   . Darvon [Propoxyphene]   . Iodine   . Percocet [Oxycodone-Acetaminophen]   . Shellfish Allergy   . Sulfa Antibiotics     Consultations:  GI   Procedures/Studies: CT ABDOMEN PELVIS WO CONTRAST  Result Date: 02/19/2021 CLINICAL DATA:  Vomiting and diarrhea EXAM: CT ABDOMEN AND PELVIS WITHOUT CONTRAST TECHNIQUE: Multidetector CT imaging of the abdomen and pelvis was performed following the standard protocol without IV contrast. COMPARISON:  None. FINDINGS: Lower chest: Lung bases demonstrate lower lobe bronchiectasis and pulmonary fibrosis. Mild fibrosis in the right middle lobe and lingula. No acute airspace disease. Hepatobiliary: Status post cholecystectomy. Calcified granuloma in the liver. Intra and extrahepatic biliary ductal dilatation, common bile  duct diameter up to 12 mm. Pancreas: Unremarkable. No pancreatic ductal dilatation or surrounding inflammatory changes. Spleen: Normal in size without focal abnormality. Adrenals/Urinary Tract: Adrenal glands are normal. Kidneys show no hydronephrosis. The urinary bladder is partially obscured by artifact Stomach/Bowel: Stomach nonenlarged. Fluid-filled nondistended pelvic small bowel loops. Diffuse fluid within the colon. No acute bowel wall thickening. Negative appendix. Vascular/Lymphatic: Nonaneurysmal aorta.  No suspicious nodes Reproductive: Status post hysterectomy. No adnexal masses. Other: No free air or free fluid Musculoskeletal: Fatty atrophy of left iliopsoas muscle. Left hip replacement with artifact. No acute or  suspicious osseous abnormality. IMPRESSION: 1. Fluid-filled small and large bowel, consistent with a diarrheal illness and probable enteritis. No acute bowel wall thickening. No obstruction. 2. Intra and extrahepatic biliary ductal dilatation post cholecystectomy. Recommend correlation with LFTs. 3. Pulmonary fibrosis at the lung bases. Electronically Signed   By: Jasmine Pang M.D.   On: 02/19/2021 21:39   CT Head Wo Contrast  Result Date: 02/19/2021 CLINICAL DATA:  Episode of emesis and diarrhea with syncope EXAM: CT HEAD WITHOUT CONTRAST CT CERVICAL SPINE WITHOUT CONTRAST TECHNIQUE: Multidetector CT imaging of the head and cervical spine was performed following the standard protocol without intravenous contrast. Multiplanar CT image reconstructions of the cervical spine were also generated. COMPARISON:  None. FINDINGS: CT HEAD FINDINGS Brain: No evidence of acute infarction, hemorrhage, hydrocephalus, extra-axial collection, visible mass lesion or mass effect. Symmetric prominence of the ventricles, cisterns and sulci compatible with parenchymal volume loss. Patchy areas of white matter hypoattenuation are most compatible with chronic microvascular angiopathy. Partially empty appearance of the sella. Remaining midline intracranial structures are unremarkable. Cerebellar tonsils are normally positioned. Scattered benign dural calcifications. Vascular: Atherosclerotic calcification of the carotid siphons. No hyperdense vessel. Skull: No calvarial fracture or suspicious osseous lesion. No scalp swelling or hematoma. Hyperostosis frontalis interna, a typically benign incidental finding. Sinuses/Orbits: Paranasal sinuses and mastoid air cells are predominantly clear. Included orbital structures are unremarkable. Other: Debris noted in the bilateral external auditory canals. Edentulous with dental prostheses. Mild bilateral TMJ arthrosis. CT CERVICAL SPINE FINDINGS Alignment: Stabilization collar is absent at the  time of exam. There is notable cervical flexion. Likely contributing to the reversal the normal cervical lordosis. No evidence of traumatic listhesis. No abnormally widened, perched or jumped facets. Normal alignment of the craniocervical and atlantoaxial articulations. Skull base and vertebrae: No acute skull base fracture. No vertebral body fracture or height loss. Normal bone mineralization. No worrisome osseous lesions. Multilevel cervical spondylitic changes as below. Mild arthrosis at the atlantodental interval with some spurring about the anterior arch C1. Soft tissues and spinal canal: No pre or paravertebral fluid or swelling. No visible canal hematoma. Disc levels: Multilevel intervertebral disc height loss with spondylitic endplate changes. Discogenic spurring is most pronounced anteriorly albeit with some shallow posterior disc osteophyte complexes in addition to uncinate spurring and facet hypertrophic changes. Some partial effacement of the ventral thecal sac is noted C4-C7 resulting in mild canal stenosis. At least mild bilateral neural foraminal narrowing across these levels as well. Upper chest: No acute abnormality in the upper chest or imaged lung apices. Other: No concerning thyroid nodules or masses. IMPRESSION: 1. No acute intracranial abnormality. 2. Mild parenchymal volume loss and chronic microvascular ischemic white matter disease. 3. Partially empty appearance of the sella, a nonspecific finding. 4. Debris in the bilateral external auditory canals, correlate for cerumen impaction. 5. No acute fracture or traumatic listhesis of the cervical spine. 6. Multilevel cervical spondylitic and facet degenerative changes,  most pronounced C4-C7 where there is mild canal stenosis neural foraminal narrowing. Electronically Signed   By: Kreg Shropshire M.D.   On: 02/19/2021 21:40   CT Cervical Spine Wo Contrast  Result Date: 02/19/2021 CLINICAL DATA:  Episode of emesis and diarrhea with syncope EXAM: CT  HEAD WITHOUT CONTRAST CT CERVICAL SPINE WITHOUT CONTRAST TECHNIQUE: Multidetector CT imaging of the head and cervical spine was performed following the standard protocol without intravenous contrast. Multiplanar CT image reconstructions of the cervical spine were also generated. COMPARISON:  None. FINDINGS: CT HEAD FINDINGS Brain: No evidence of acute infarction, hemorrhage, hydrocephalus, extra-axial collection, visible mass lesion or mass effect. Symmetric prominence of the ventricles, cisterns and sulci compatible with parenchymal volume loss. Patchy areas of white matter hypoattenuation are most compatible with chronic microvascular angiopathy. Partially empty appearance of the sella. Remaining midline intracranial structures are unremarkable. Cerebellar tonsils are normally positioned. Scattered benign dural calcifications. Vascular: Atherosclerotic calcification of the carotid siphons. No hyperdense vessel. Skull: No calvarial fracture or suspicious osseous lesion. No scalp swelling or hematoma. Hyperostosis frontalis interna, a typically benign incidental finding. Sinuses/Orbits: Paranasal sinuses and mastoid air cells are predominantly clear. Included orbital structures are unremarkable. Other: Debris noted in the bilateral external auditory canals. Edentulous with dental prostheses. Mild bilateral TMJ arthrosis. CT CERVICAL SPINE FINDINGS Alignment: Stabilization collar is absent at the time of exam. There is notable cervical flexion. Likely contributing to the reversal the normal cervical lordosis. No evidence of traumatic listhesis. No abnormally widened, perched or jumped facets. Normal alignment of the craniocervical and atlantoaxial articulations. Skull base and vertebrae: No acute skull base fracture. No vertebral body fracture or height loss. Normal bone mineralization. No worrisome osseous lesions. Multilevel cervical spondylitic changes as below. Mild arthrosis at the atlantodental interval with  some spurring about the anterior arch C1. Soft tissues and spinal canal: No pre or paravertebral fluid or swelling. No visible canal hematoma. Disc levels: Multilevel intervertebral disc height loss with spondylitic endplate changes. Discogenic spurring is most pronounced anteriorly albeit with some shallow posterior disc osteophyte complexes in addition to uncinate spurring and facet hypertrophic changes. Some partial effacement of the ventral thecal sac is noted C4-C7 resulting in mild canal stenosis. At least mild bilateral neural foraminal narrowing across these levels as well. Upper chest: No acute abnormality in the upper chest or imaged lung apices. Other: No concerning thyroid nodules or masses. IMPRESSION: 1. No acute intracranial abnormality. 2. Mild parenchymal volume loss and chronic microvascular ischemic white matter disease. 3. Partially empty appearance of the sella, a nonspecific finding. 4. Debris in the bilateral external auditory canals, correlate for cerumen impaction. 5. No acute fracture or traumatic listhesis of the cervical spine. 6. Multilevel cervical spondylitic and facet degenerative changes, most pronounced C4-C7 where there is mild canal stenosis neural foraminal narrowing. Electronically Signed   By: Kreg Shropshire M.D.   On: 02/19/2021 21:40   CT ABDOMEN PELVIS W CONTRAST  Result Date: 02/22/2021 CLINICAL DATA:  Diarrhea, lower GI bleed.  Enteritis suspected. EXAM: CT ABDOMEN AND PELVIS WITH CONTRAST TECHNIQUE: Multidetector CT imaging of the abdomen and pelvis was performed using the standard protocol following bolus administration of intravenous contrast. CONTRAST:  OMNIPAQUE IOHEXOL 300 MG/ML SOLN. The patient was pre-medicated with emergent prep or prior contrast reaction. No immediate breakthrough reaction following the procedure. COMPARISON:  02/19/2021 FINDINGS: Lower chest: Bronchiectasis and scarring in the lung bases, stable. No effusions. Hepatobiliary: Biliary  ductal dilatation with common bile duct measuring up to 13  mm, stable. Prior cholecystectomy. No suspicious focal hepatic abnormality. Pancreas: No focal abnormality or ductal dilatation. Spleen: No focal abnormality.  Normal size. Adrenals/Urinary Tract: No adrenal abnormality. No focal renal abnormality. No stones or hydronephrosis. Urinary bladder is unremarkable. Stomach/Bowel: Normal appendix. There is wall thickening involving the distal transverse colon, splenic flexure, and much of the descending colon compatible with colitis. Mild gaseous distention of the colon proximal to the area of wall thickening with layering fluid. Small bowel and stomach decompressed. Sigmoid diverticulosis. Vascular/Lymphatic: Scattered aortic calcifications. Mesenteric vessels appear patent without significant disease. No evidence of aneurysm or adenopathy. Reproductive: Prior hysterectomy.  No adnexal masses. Other: Small to moderate free fluid in the pelvis.  No free air. Musculoskeletal: No acute bony abnormality. IMPRESSION: Wall thickening involving the colon from the distal transverse colon through the descending colon compatible with colitis. Differential considerations would include infectious, inflammatory, or ischemic causes. No visible significant vascular disease. Prior cholecystectomy with intrahepatic and extrahepatic biliary duct dilatation, likely related to post cholecystectomy state. Small to moderate free fluid in the pelvis. Sigmoid diverticulosis. Electronically Signed   By: Charlett Nose M.D.   On: 02/22/2021 18:48   DG Chest Portable 1 View  Result Date: 02/19/2021 CLINICAL DATA:  Shortness of breath, vomiting, and diarrhea. Then passed out. EXAM: PORTABLE CHEST 1 VIEW COMPARISON:  None. FINDINGS: Shallow inspiration. Coarse linear and alveolar infiltrates in both lung bases, greater on the right, with evidence of bronchiectasis and bronchial wall thickening. Changes could represent chronic fibrosis,  superimposed pneumonia/atelectasis, or aspiration. Heart size and pulmonary vascularity are normal. Mediastinal contours appear intact. Degenerative changes and postoperative change in the right shoulder. IMPRESSION: Coarse linear and alveolar infiltrates in both lung bases with evidence of bronchiectasis and bronchial wall thickening. Electronically Signed   By: Burman Nieves M.D.   On: 02/19/2021 21:41       Subjective: No diarrhea, abdominal pain, nausea or vomiting.  Son at bedside  Discharge Exam: Vitals:   02/26/21 0438 02/26/21 0814  BP: (!) 145/77 (!) 153/83  Pulse: 78 91  Resp: 20 16  Temp: 98.3 F (36.8 C) 98.2 F (36.8 C)  SpO2: 93% 99%   Vitals:   02/25/21 1556 02/25/21 2016 02/26/21 0438 02/26/21 0814  BP: (!) 145/77 136/77 (!) 145/77 (!) 153/83  Pulse: 100 89 78 91  Resp: Temp: 98.6 F (37 C) 98.5 F (36.9 C) 98.3 F (36.8 C) 98.2 F (36.8 C)  TempSrc: Oral Oral Oral Oral  SpO2: 96% 96% 93% 99%  Weight:   77.8 kg   Height:        General: Pt is alert, awake, not in acute distress Cardiovascular: RRR, S1/S2 +, no rubs, no gallops Respiratory: CTA bilaterally, no wheezing, no rhonchi Abdominal: Soft, NT, ND, bowel sounds + Extremities: no edema, no cyanosis    The results of significant diagnostics from this hospitalization (including imaging, microbiology, ancillary and laboratory) are listed below for reference.     Microbiology: Recent Results (from the past 240 hour(s))  Urine culture     Status: Abnormal   Collection Time: 02/19/21  8:28 PM   Specimen: Urine, Random  Result Value Ref Range Status   Specimen Description   Final    URINE, RANDOM Performed at [facility], [address], [place], [place] [address]    Special Requests   Final    NONE Performed at [facility], [address] [place], [place] [address]  Culture MULTIPLE SPECIES PRESENT, SUGGEST RECOLLECTION (A)  Final   Report Status  02/21/2021 FINAL  Final  Resp Panel by RT-PCR (Flu A&B, Covid) Urine, Clean Catch     Status: None   Collection Time: 02/19/21  9:07 PM   Specimen: Urine, Clean Catch; Nasopharyngeal(NP) swabs in vial transport medium  Result Value Ref Range Status   SARS Coronavirus 2 by RT PCR NEGATIVE NEGATIVE Final    Comment: (NOTE) SARS-CoV-2 target nucleic acids are NOT DETECTED.  The SARS-CoV-2 RNA is generally detectable in upper respiratory specimens during the acute phase of infection. The lowest concentration of SARS-CoV-2 viral copies this assay can detect is 138 copies/mL. A negative result does not preclude SARS-Cov-2 infection and should not be used as the sole basis for treatment or other patient management decisions. A negative result may occur with  improper specimen collection/handling, submission of specimen other than nasopharyngeal swab, presence of viral mutation(s) within the areas targeted by this assay, and inadequate number of viral copies(<138 copies/mL). A negative result must be combined with clinical observations, patient history, and epidemiological information. The expected result is Negative.  Fact Sheet for Patients:  BloggerCourse.com  Fact Sheet for Healthcare Providers:  SeriousBroker.it  This test is no t yet approved or cleared by the [place] FDA and  has been authorized for detection and/or diagnosis of SARS-CoV-2 by FDA under an Emergency Use Authorization (EUA). This EUA will remain  in effect (meaning this test can be used) for the duration of the COVID-19 declaration under Section 564(b)(1) of the Act, 21 U.S.C.section 360bbb-3(b)(1), unless the authorization is terminated  or revoked sooner.       Influenza A by PCR NEGATIVE NEGATIVE Final   Influenza B by PCR NEGATIVE NEGATIVE Final    Comment: (NOTE) The Xpert Xpress SARS-CoV-2/FLU/RSV plus assay is intended as an aid in the diagnosis of  influenza from Nasopharyngeal swab specimens and should not be used as a sole basis for treatment. Nasal washings and aspirates are unacceptable for Xpert Xpress SARS-CoV-2/FLU/RSV testing.  Fact Sheet for Patients: BloggerCourse.com  Fact Sheet for Healthcare Providers: SeriousBroker.it  This test is not yet approved or cleared by the [place] FDA and has been authorized for detection and/or diagnosis of SARS-CoV-2 by FDA under an Emergency Use Authorization (EUA). This EUA will remain in effect (meaning this test can be used) for the duration of the COVID-19 declaration under Section 564(b)(1) of the Act, 21 U.S.C. section 360bbb-3(b)(1), unless the authorization is terminated or revoked.  Performed at [facility], [address] [place], [place] [address]   Gastrointestinal Panel by PCR , Stool     Status: None   Collection Time: 02/19/21 10:50 PM   Specimen: Stool  Result Value Ref Range Status   Campylobacter species NOT DETECTED NOT DETECTED Final   Plesimonas shigelloides NOT DETECTED NOT DETECTED Final   Salmonella species NOT DETECTED NOT DETECTED Final   Yersinia enterocolitica NOT DETECTED NOT DETECTED Final   Vibrio species NOT DETECTED NOT DETECTED Final   Vibrio cholerae NOT DETECTED NOT DETECTED Final   Enteroaggregative E coli (EAEC) NOT DETECTED NOT DETECTED Final   Enteropathogenic E coli (EPEC) NOT DETECTED NOT DETECTED Final   Enterotoxigenic E coli (ETEC) NOT DETECTED NOT DETECTED Final   Shiga like toxin producing E coli (STEC) NOT DETECTED NOT DETECTED Final   Shigella/Enteroinvasive E coli (EIEC) NOT DETECTED NOT DETECTED Final   Cryptosporidium NOT DETECTED NOT DETECTED Final   Cyclospora cayetanensis NOT  DETECTED NOT DETECTED Final   Entamoeba histolytica NOT DETECTED NOT DETECTED Final   Giardia lamblia NOT DETECTED NOT DETECTED Final   Adenovirus F40/41 NOT DETECTED NOT DETECTED  Final   Astrovirus NOT DETECTED NOT DETECTED Final   Norovirus GI/GII NOT DETECTED NOT DETECTED Final   Rotavirus A NOT DETECTED NOT DETECTED Final   Sapovirus (I, II, IV, and V) NOT DETECTED NOT DETECTED Final    Comment: Performed at [facility], [address] [place], [place] [address]  C Difficile Quick Screen w PCR reflex     Status: None   Collection Time: 02/19/21 10:50 PM   Specimen: Stool  Result Value Ref Range Status   C Diff antigen NEGATIVE NEGATIVE Final   C Diff toxin NEGATIVE NEGATIVE Final   C Diff interpretation No C. difficile detected.  Final    Comment: Performed at [facility], [address] [place], [place] [address]     Labs: BNP (last 3 results) No results for input(s): BNP in the last 8760 hours. Basic Metabolic Panel: Recent Labs  Lab 02/19/21 2028 02/20/21 0414 02/21/21 0520 02/22/21 0448 02/23/21 0511 02/24/21 0504 02/25/21 0508 02/26/21 0513  NA 139 139   < > 139 136 138 138 137  K 2.9* 4.1   < > 3.9 3.1* 3.1* 2.9* 3.3*  CL 102 106   < > 107 103 106 103 105  CO2 25 22   < > 25 25 27 27 25   GLUCOSE 174* 156*   < > 111* 88 100* 100* 99  BUN 25* 23   < > 9 10 5* <5* <5*  CREATININE 1.69* 1.34*  1.33*   < > 0.78 0.81 0.82 0.85 0.75  CALCIUM 9.2 8.4*   < > 8.4* 8.4* 8.5* 8.3* 8.0*  MG 1.7 1.8  --   --   --   --   --  1.5*   < > = values in this interval not displayed.   Liver Function Tests: Recent Labs  Lab 02/19/21 2028 02/22/21 0448 02/23/21 0511 02/24/21 0504  AST 34 15 15 38  ALT 20 12 10 16   ALKPHOS 111 68 77 76  BILITOT 0.6 0.7 0.7 0.5  PROT 8.4* 6.2* 6.1* 6.4*  ALBUMIN 4.1 3.2* 3.2* 3.3*   Recent Labs  Lab 02/19/21 2028  LIPASE 33   No results for input(s): AMMONIA in the last 168 hours. CBC: Recent Labs  Lab 02/19/21 2028 02/20/21 0414 02/22/21 0448 02/23/21 0511 02/24/21 0504 02/25/21 0508 02/26/21 0513  WBC 12.3*   < > 27.9* 26.4* 15.0* 12.4* 11.9*  NEUTROABS 4.3  --   --   --   --    --   --   HGB 11.9*   < > 10.6* 8.5* 8.7* 8.9* 7.9*  HCT 39.4   < > 34.2* 26.7* 27.6* 28.5* 25.8*  MCV 81.7   < > 81.2 79.5* 80.0 80.3 81.4  PLT 506*   < > 269 221 214 237 232   < > = values in this interval not displayed.   Cardiac Enzymes: No results for input(s): CKTOTAL, CKMB, CKMBINDEX, TROPONINI in the last 168 hours. BNP: Invalid input(s): POCBNP CBG: No results for input(s): GLUCAP in the last 168 hours. D-Dimer No results for input(s): DDIMER in the last 72 hours. Hgb A1c No results for input(s): HGBA1C in the last 72 hours. Lipid Profile No results for input(s): CHOL, HDL, LDLCALC, TRIG, CHOLHDL, LDLDIRECT in the last 72 hours.  Thyroid function studies No results for input(s): TSH, T4TOTAL, T3FREE, THYROIDAB in the last 72 hours.  Invalid input(s): FREET3 Anemia work up Recent Labs    02/25/21 0508  FERRITIN 112  TIBC 288  IRON 51   Urinalysis    Component Value Date/Time   COLORURINE YELLOW (A) 02/19/2021 2028   APPEARANCEUR CLEAR (A) 02/19/2021 2028   LABSPEC 1.018 02/19/2021 2028   PHURINE 5.0 02/19/2021 2028   GLUCOSEU NEGATIVE 02/19/2021 2028   HGBUR NEGATIVE 02/19/2021 2028   BILIRUBINUR NEGATIVE 02/19/2021 2028   KETONESUR NEGATIVE 02/19/2021 2028   PROTEINUR NEGATIVE 02/19/2021 2028   NITRITE NEGATIVE 02/19/2021 2028   LEUKOCYTESUR LARGE (A) 02/19/2021 2028   Sepsis Labs Invalid input(s): PROCALCITONIN,  WBC,  LACTICIDVEN Microbiology Recent Results (from the past 240 hour(s))  Urine culture     Status: Abnormal   Collection Time: 02/19/21  8:28 PM   Specimen: Urine, Random  Result Value Ref Range Status   Specimen Description   Final    URINE, RANDOM Performed at [facility], [address], [place], [place] [address]    Special Requests   Final    NONE Performed at [facility], [address] [place], [place] [address]    Culture MULTIPLE SPECIES PRESENT, SUGGEST RECOLLECTION (A)  Final   Report Status  02/21/2021 FINAL  Final  Resp Panel by RT-PCR (Flu A&B, Covid) Urine, Clean Catch     Status: None   Collection Time: 02/19/21  9:07 PM   Specimen: Urine, Clean Catch; Nasopharyngeal(NP) swabs in vial transport medium  Result Value Ref Range Status   SARS Coronavirus 2 by RT PCR NEGATIVE NEGATIVE Final    Comment: (NOTE) SARS-CoV-2 target nucleic acids are NOT DETECTED.  The SARS-CoV-2 RNA is generally detectable in upper respiratory specimens during the acute phase of infection. The lowest concentration of SARS-CoV-2 viral copies this assay can detect is 138 copies/mL. A negative result does not preclude SARS-Cov-2 infection and should not be used as the sole basis for treatment or other patient management decisions. A negative result may occur with  improper specimen collection/handling, submission of specimen other than nasopharyngeal swab, presence of viral mutation(s) within the areas targeted by this assay, and inadequate number of viral copies(<138 copies/mL). A negative result must be combined with clinical observations, patient history, and epidemiological information. The expected result is Negative.  Fact Sheet for Patients:  BloggerCourse.com  Fact Sheet for Healthcare Providers:  SeriousBroker.it  This test is no t yet approved or cleared by the [place] FDA and  has been authorized for detection and/or diagnosis of SARS-CoV-2 by FDA under an Emergency Use Authorization (EUA). This EUA will remain  in effect (meaning this test can be used) for the duration of the COVID-19 declaration under Section 564(b)(1) of the Act, 21 U.S.C.section 360bbb-3(b)(1), unless the authorization is terminated  or revoked sooner.       Influenza A by PCR NEGATIVE NEGATIVE Final   Influenza B by PCR NEGATIVE NEGATIVE Final    Comment: (NOTE) The Xpert Xpress SARS-CoV-2/FLU/RSV plus assay is intended as an aid in the diagnosis of  influenza from Nasopharyngeal swab specimens and should not be used as a sole basis for treatment. Nasal washings and aspirates are unacceptable for Xpert Xpress SARS-CoV-2/FLU/RSV testing.  Fact Sheet for Patients: BloggerCourse.com  Fact Sheet for Healthcare Providers: SeriousBroker.it  This test is not yet approved or cleared by the [place] FDA and has been authorized for detection and/or diagnosis  of SARS-CoV-2 by FDA under an Emergency Use Authorization (EUA). This EUA will remain in effect (meaning this test can be used) for the duration of the COVID-19 declaration under Section 564(b)(1) of the Act, 21 U.S.C. section 360bbb-3(b)(1), unless the authorization is terminated or revoked.  Performed at [facility], [address] [place], [place] [address]   Gastrointestinal Panel by PCR , Stool     Status: None   Collection Time: 02/19/21 10:50 PM   Specimen: Stool  Result Value Ref Range Status   Campylobacter species NOT DETECTED NOT DETECTED Final   Plesimonas shigelloides NOT DETECTED NOT DETECTED Final   Salmonella species NOT DETECTED NOT DETECTED Final   Yersinia enterocolitica NOT DETECTED NOT DETECTED Final   Vibrio species NOT DETECTED NOT DETECTED Final   Vibrio cholerae NOT DETECTED NOT DETECTED Final   Enteroaggregative E coli (EAEC) NOT DETECTED NOT DETECTED Final   Enteropathogenic E coli (EPEC) NOT DETECTED NOT DETECTED Final   Enterotoxigenic E coli (ETEC) NOT DETECTED NOT DETECTED Final   Shiga like toxin producing E coli (STEC) NOT DETECTED NOT DETECTED Final   Shigella/Enteroinvasive E coli (EIEC) NOT DETECTED NOT DETECTED Final   Cryptosporidium NOT DETECTED NOT DETECTED Final   Cyclospora cayetanensis NOT DETECTED NOT DETECTED Final   Entamoeba histolytica NOT DETECTED NOT DETECTED Final   Giardia lamblia NOT DETECTED NOT DETECTED Final   Adenovirus F40/41 NOT DETECTED NOT DETECTED  Final   Astrovirus NOT DETECTED NOT DETECTED Final   Norovirus GI/GII NOT DETECTED NOT DETECTED Final   Rotavirus A NOT DETECTED NOT DETECTED Final   Sapovirus (I, II, IV, and V) NOT DETECTED NOT DETECTED Final    Comment: Performed at [facility], [address] [place], [place] [address]  C Difficile Quick Screen w PCR reflex     Status: None   Collection Time: 02/19/21 10:50 PM   Specimen: Stool  Result Value Ref Range Status   C Diff antigen NEGATIVE NEGATIVE Final   C Diff toxin NEGATIVE NEGATIVE Final   C Diff interpretation No C. difficile detected.  Final    Comment: Performed at [facility], [address] [place], [place] [address]     Time coordinating discharge: Over 30 minutes  SIGNED:   Lynn Ito, MD  [facility] 02/26/2021, 9:43 AM Pager   If 7PM-7AM, please contact night-coverage [email or web address] Password [email or web address]

## 2021-02-26 NOTE — Progress Notes (Signed)
Mobility Specialist - Progress Note   02/26/21 1300  Mobility  Activity Refused mobility  Mobility performed by Mobility specialist    Pt politely declined mobility. Anticipating d/c later this date. Sister at bedside.    Filiberto Pinks Mobility Specialist 02/26/21, 1:04 PM

## 2021-06-07 ENCOUNTER — Other Ambulatory Visit: Payer: Self-pay | Admitting: Gastroenterology

## 2021-06-07 ENCOUNTER — Other Ambulatory Visit (HOSPITAL_COMMUNITY): Payer: Self-pay | Admitting: Gastroenterology

## 2021-06-07 DIAGNOSIS — R1084 Generalized abdominal pain: Secondary | ICD-10-CM

## 2021-06-07 DIAGNOSIS — K559 Vascular disorder of intestine, unspecified: Secondary | ICD-10-CM

## 2021-07-01 ENCOUNTER — Ambulatory Visit: Payer: Medicare Other | Attending: Gastroenterology

## 2021-07-26 IMAGING — CT CT CERVICAL SPINE W/O CM
3 of 4 series · 12 of 33 positions shown, 14 images · non-contrast
Comparison: None.

CLINICAL DATA: Episode of emesis and diarrhea with syncope

EXAM:
CT HEAD WITHOUT CONTRAST
CT CERVICAL SPINE WITHOUT CONTRAST
TECHNIQUE: Multidetector CT imaging of the head and cervical spine was
performed following the standard protocol without intravenous
contrast. Multiplanar CT image reconstructions of the cervical spine
were also generated.

[Series 4: sagittal bone · sagittal · 0.38mm/px · 5 of 87 slices shown, 6 images]
[im 29/87  bone]
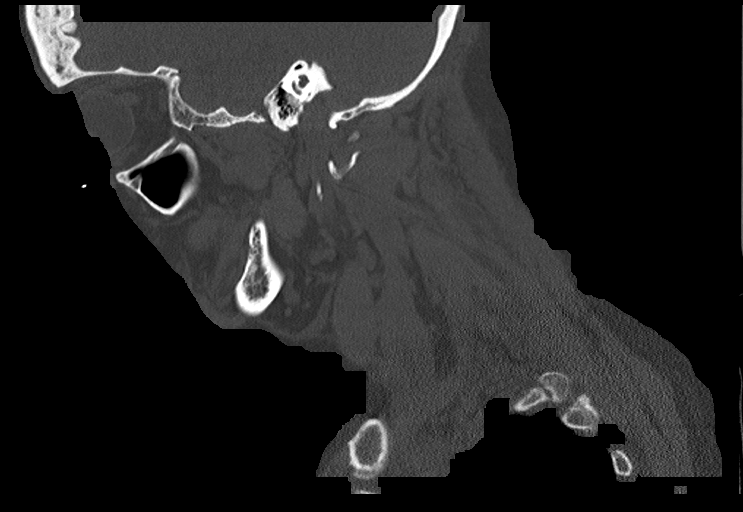
[im 36/87  bone]
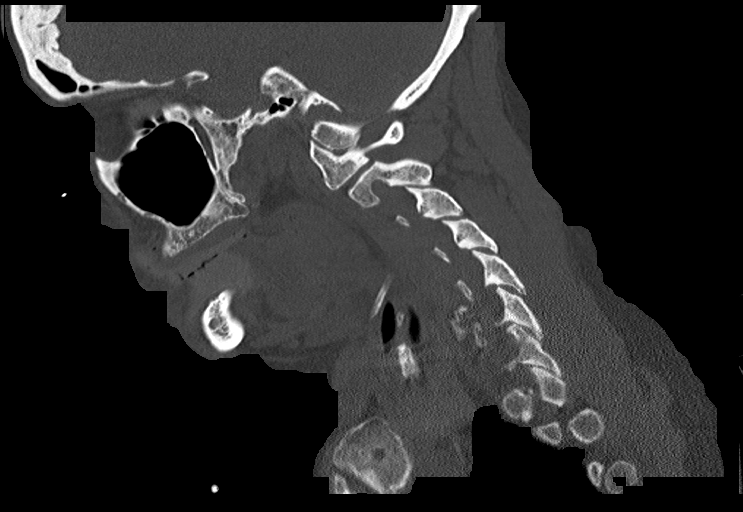
[im 44/87  soft-tissue]
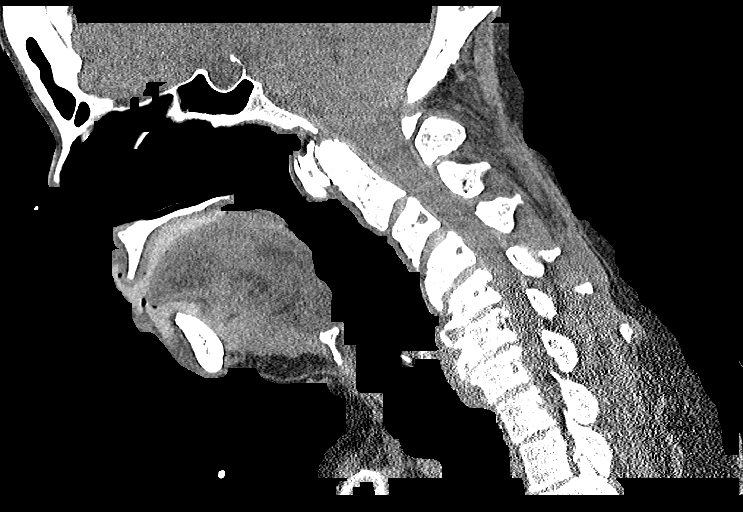
[im 44/87  bone]
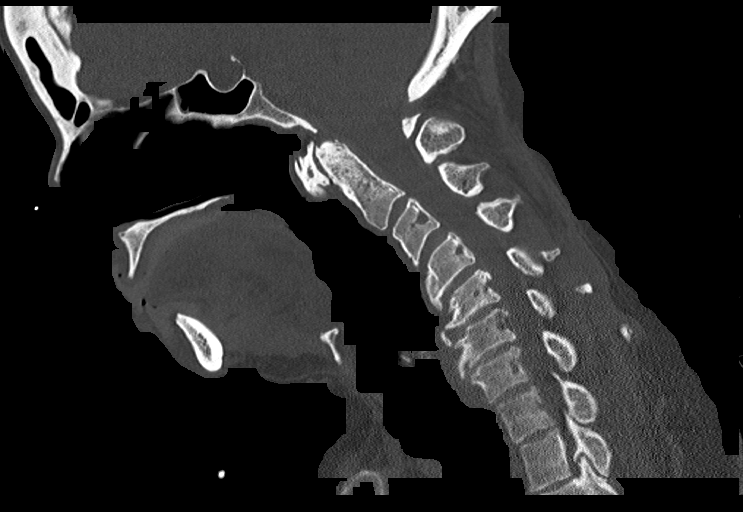
[im 51/87  bone]
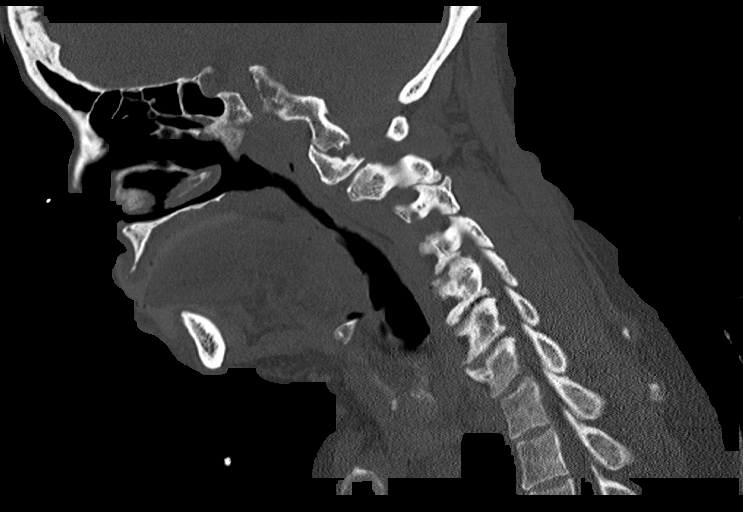
[im 58/87  bone]
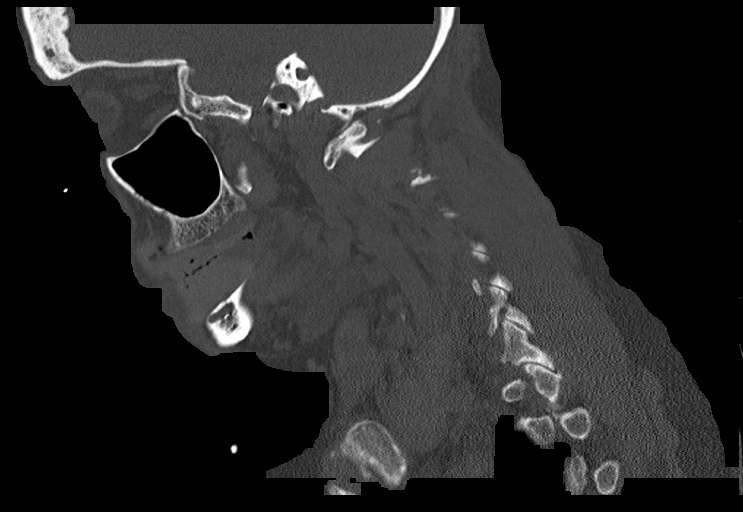

[Series 5: coronal bone · coronal · 0.46mm/px · 3 of 142 slices shown]
[im 53/142  bone]
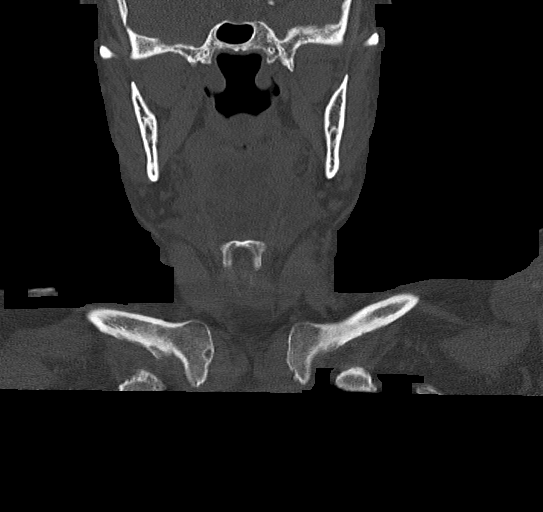
[im 65/142  bone]
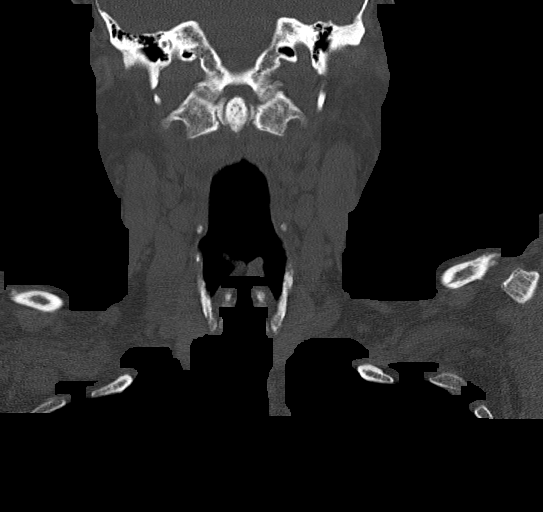
[im 77/142  bone]
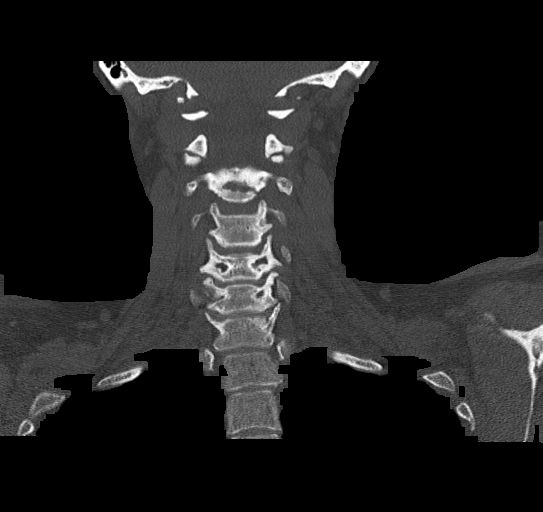

[Series 10: orthogonal bone · axial · 0.21mm/px · z∈[-57,+41]mm · 4 of 121 slices shown, 5 images]
[im 18/121  soft-tissue]
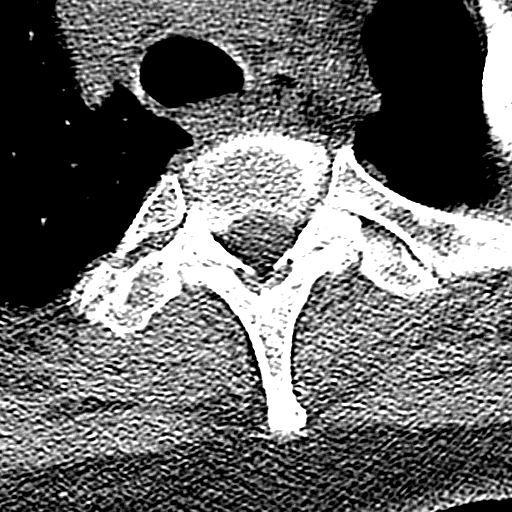
[im 18/121  bone]
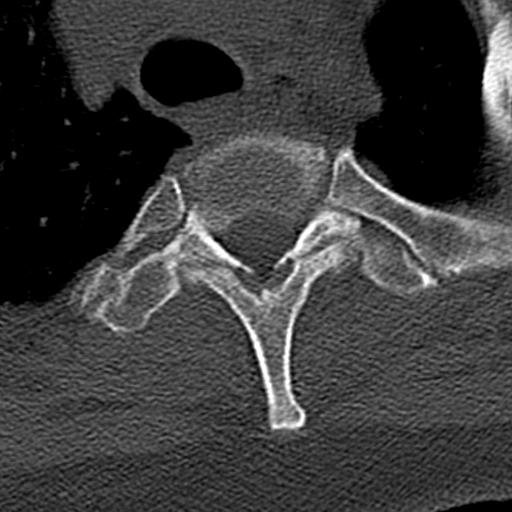
[im 52/121  bone]
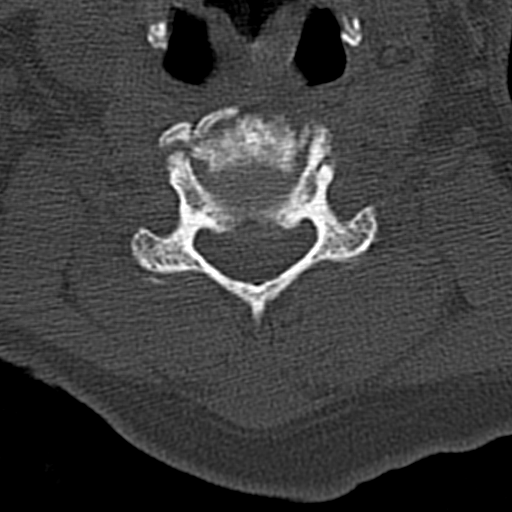
[im 69/121  bone]
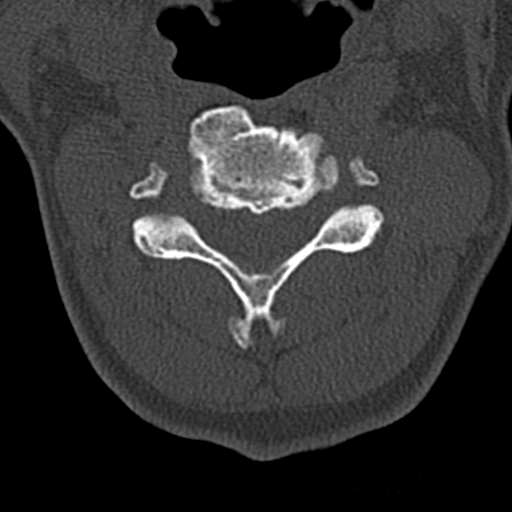
[im 103/121  bone]
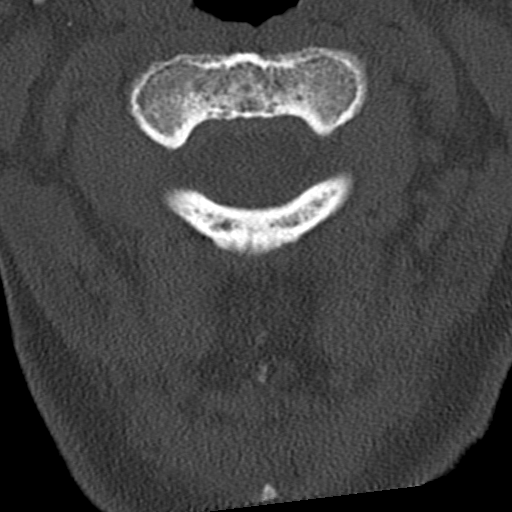

[12 of 33 positions shown; findings below may reference images not displayed]

FINDINGS: CT HEAD FINDINGS

Brain: No evidence of acute infarction, hemorrhage, hydrocephalus,
extra-axial collection, visible mass lesion or mass effect.
Symmetric prominence of the ventricles, cisterns and sulci
compatible with parenchymal volume loss. Patchy areas of white
matter hypoattenuation are most compatible with chronic
microvascular angiopathy. Partially empty appearance of the sella.
Remaining midline intracranial structures are unremarkable.
Cerebellar tonsils are normally positioned. Scattered benign dural
calcifications.

Vascular: Atherosclerotic calcification of the carotid siphons. No
hyperdense vessel.

Skull: No calvarial fracture or suspicious osseous lesion. No scalp
swelling or hematoma. Hyperostosis frontalis interna, a typically
benign incidental finding.

Sinuses/Orbits: Paranasal sinuses and mastoid air cells are
predominantly clear. Included orbital structures are unremarkable.

Other: Debris noted in the bilateral external auditory canals.
Edentulous with dental prostheses. Mild bilateral TMJ arthrosis.

CT CERVICAL SPINE FINDINGS

Alignment: Stabilization collar is absent at the time of exam. There
is notable cervical flexion. Likely contributing to the reversal the
normal cervical lordosis. No evidence of traumatic listhesis. No
abnormally widened, perched or jumped facets. Normal alignment of
the craniocervical and atlantoaxial articulations.

Skull base and vertebrae: No acute skull base fracture. No vertebral
body fracture or height loss. Normal bone mineralization. No
worrisome osseous lesions. Multilevel cervical spondylitic changes
as below. Mild arthrosis at the atlantodental interval with some
spurring about the anterior arch C1.

Soft tissues and spinal canal: No pre or paravertebral fluid or
swelling. No visible canal hematoma.

Disc levels: Multilevel intervertebral disc height loss with
spondylitic endplate changes. Discogenic spurring is most pronounced
anteriorly albeit with some shallow posterior disc osteophyte
complexes in addition to uncinate spurring and facet hypertrophic
changes. Some partial effacement of the ventral thecal sac is noted
C4-C7 resulting in mild canal stenosis. At least mild bilateral
neural foraminal narrowing across these levels as well.

Upper chest: No acute abnormality in the upper chest or imaged lung
apices.

Other: No concerning thyroid nodules or masses.
IMPRESSION: 1. No acute intracranial abnormality.
2. Mild parenchymal volume loss and chronic microvascular ischemic
white matter disease.
3. Partially empty appearance of the sella, a nonspecific finding.
4. Debris in the bilateral external auditory canals, correlate for
cerumen impaction.
5. No acute fracture or traumatic listhesis of the cervical spine.
6. Multilevel cervical spondylitic and facet degenerative changes,
most pronounced C4-C7 where there is mild canal stenosis neural
foraminal narrowing.

## 2021-07-26 IMAGING — CT CT ABD-PELV W/O CM
2 of 6 series · 14 of 46 positions shown, 18 images · non-contrast
Comparison: None.

CLINICAL DATA: Vomiting and diarrhea

EXAM:
CT ABDOMEN AND PELVIS WITHOUT CONTRAST
TECHNIQUE: Multidetector CT imaging of the abdomen and pelvis was performed
following the standard protocol without IV contrast.

[Series 2: routine abd/pel wo · axial · 0.95mm/px · z∈[-588,-178]mm · 11 of 94 slices shown, 15 images]
[im 8/94  soft-tissue]
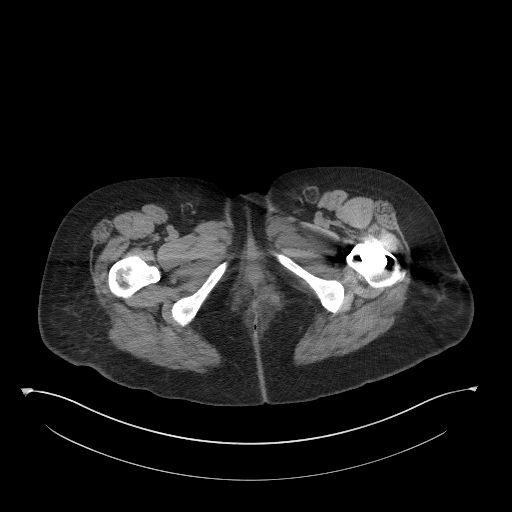
[im 8/94  bone]
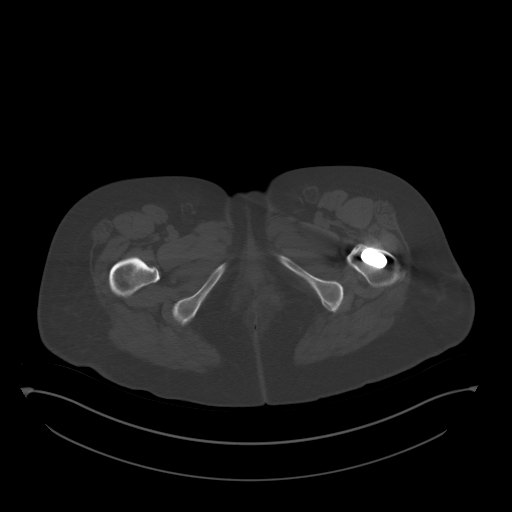
[im 16/94  soft-tissue]
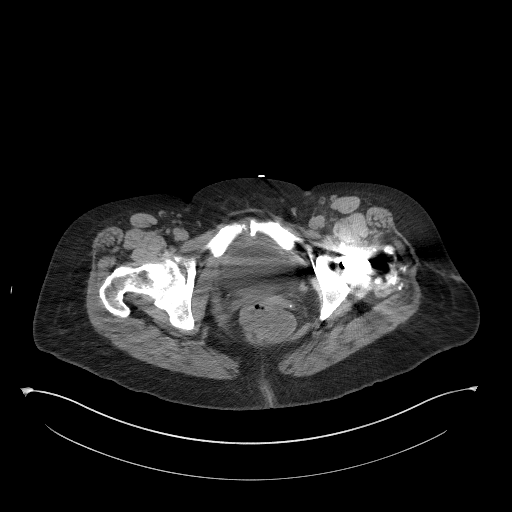
[im 28/94  soft-tissue]
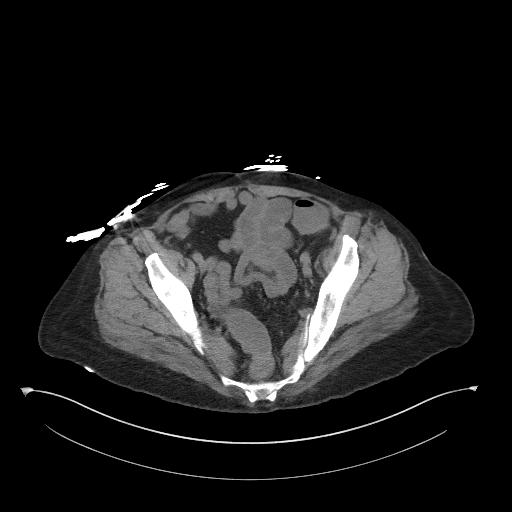
[im 35/94  soft-tissue]
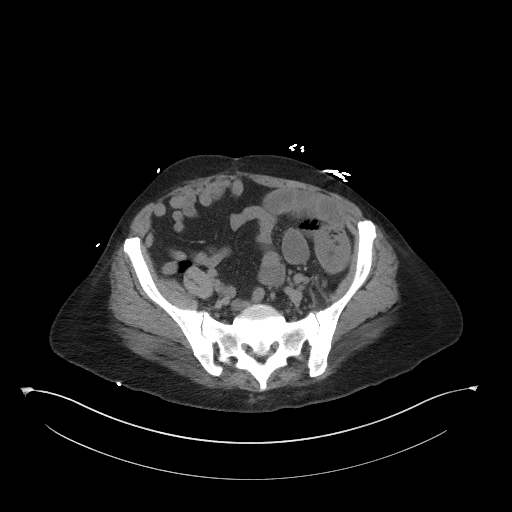
[im 47/94  soft-tissue]
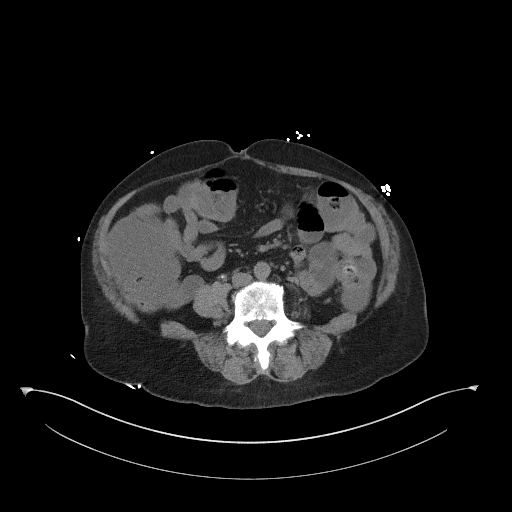
[im 59/94  soft-tissue]
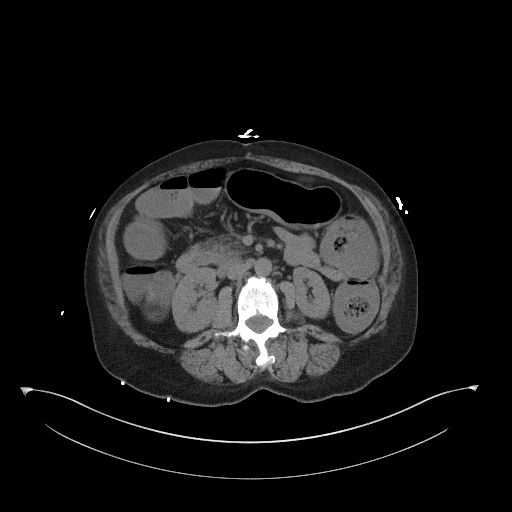
[im 66/94  soft-tissue]
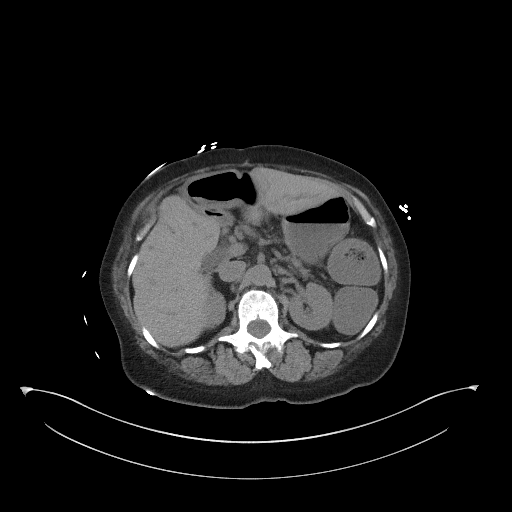
[im 78/94  soft-tissue]
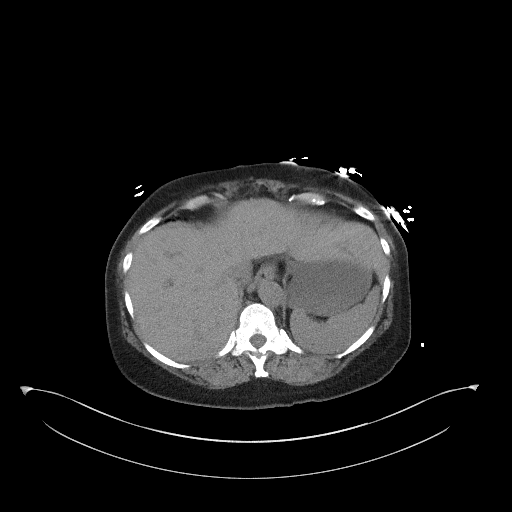
[im 78/94  lung]
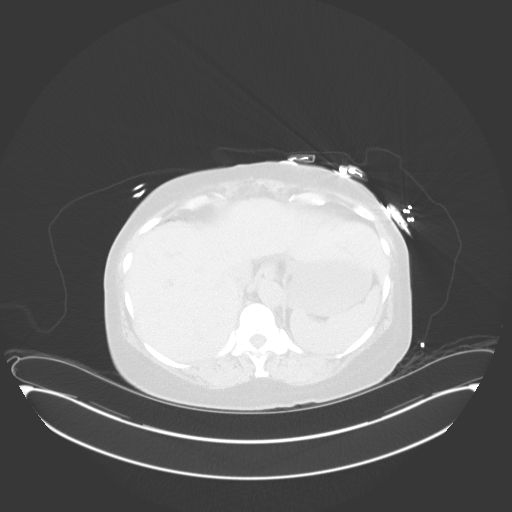
[im 82/94  lung]
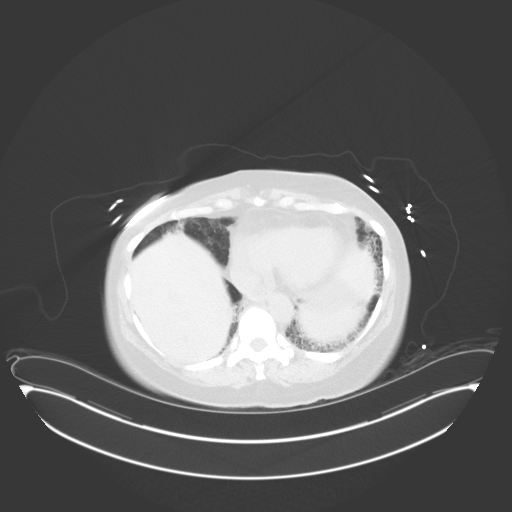
[im 86/94  soft-tissue]
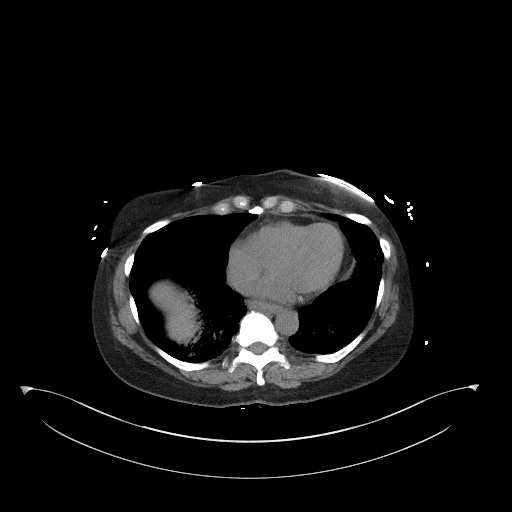
[im 86/94  lung]
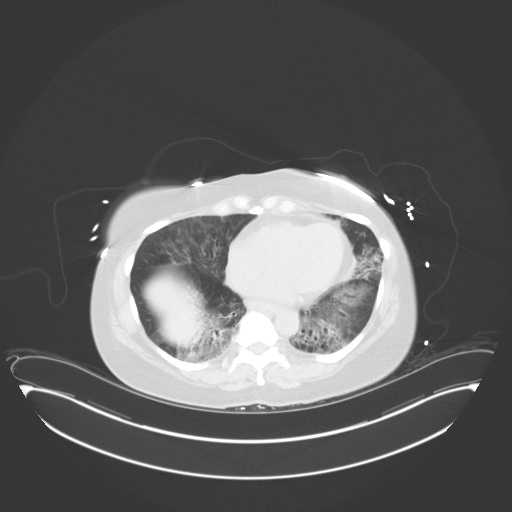
[im 86/94  bone]
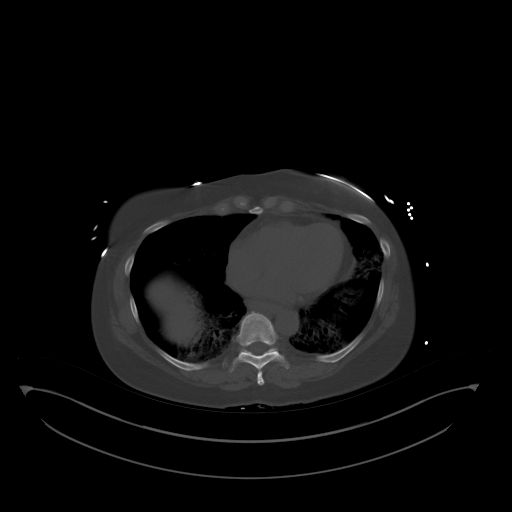
[im 90/94  lung]
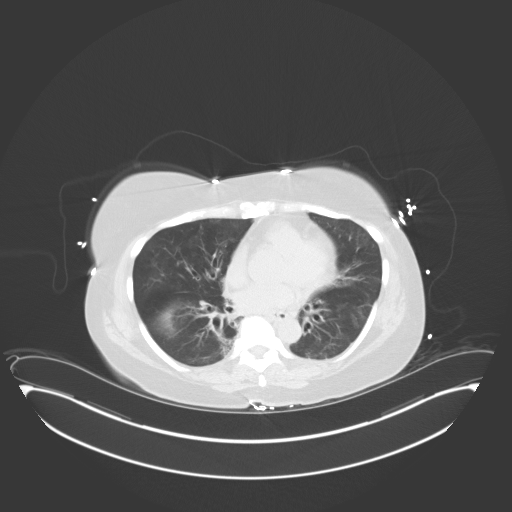

[Series 5: coronal st · coronal · 0.83mm/px · 3 of 87 slices shown]
[im 22/87  soft-tissue]
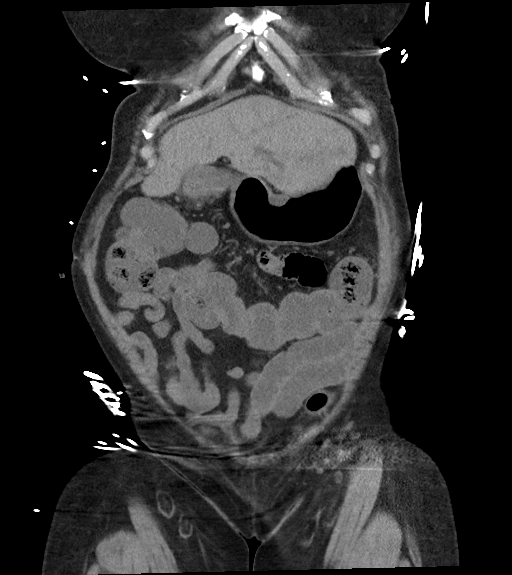
[im 44/87  soft-tissue]
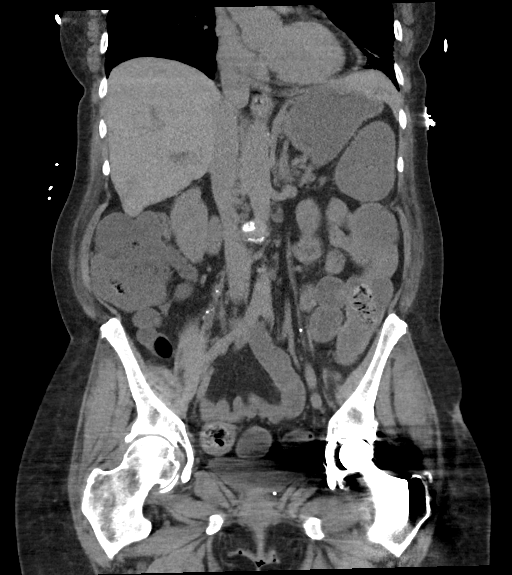
[im 65/87  soft-tissue]
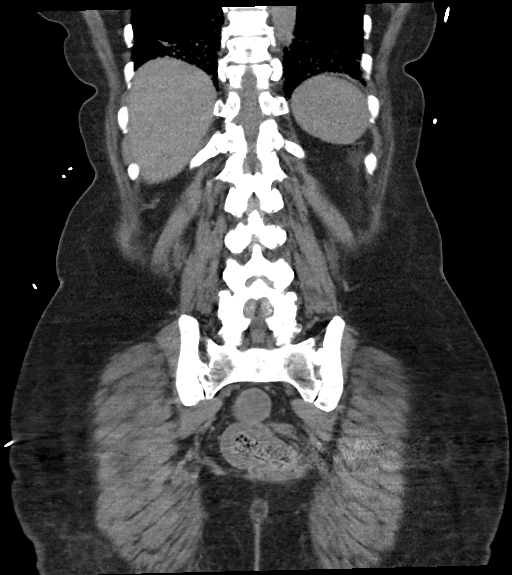

[14 of 46 positions shown; findings below may reference images not displayed]

FINDINGS: Lower chest: Lung bases demonstrate lower lobe bronchiectasis and
pulmonary fibrosis. Mild fibrosis in the right middle lobe and
lingula. No acute airspace disease.

Hepatobiliary: Status post cholecystectomy. Calcified granuloma in
the liver. Intra and extrahepatic biliary ductal dilatation, common
bile duct diameter up to 12 mm.

Pancreas: Unremarkable. No pancreatic ductal dilatation or
surrounding inflammatory changes.

Spleen: Normal in size without focal abnormality.

Adrenals/Urinary Tract: Adrenal glands are normal. Kidneys show no
hydronephrosis. The urinary bladder is partially obscured by
artifact

Stomach/Bowel: Stomach nonenlarged. Fluid-filled nondistended pelvic
small bowel loops. Diffuse fluid within the colon. No acute bowel
wall thickening. Negative appendix.

Vascular/Lymphatic: Nonaneurysmal aorta.  No suspicious nodes

Reproductive: Status post hysterectomy. No adnexal masses.

Other: No free air or free fluid

Musculoskeletal: Fatty atrophy of left iliopsoas muscle. Left hip
replacement with artifact. No acute or suspicious osseous
abnormality.
IMPRESSION: 1. Fluid-filled small and large bowel, consistent with a diarrheal
illness and probable enteritis. No acute bowel wall thickening. No
obstruction.
2. Intra and extrahepatic biliary ductal dilatation post
cholecystectomy. Recommend correlation with LFTs.
3. Pulmonary fibrosis at the lung bases.
